# Patient Record
Sex: Male | Born: 1935 | Race: White | Hispanic: No | Marital: Married | Smoking: Former smoker
Health system: Southern US, Community
[De-identification: ages and names within clinical notes are randomized; demographics above are authoritative.]

## PROBLEM LIST (undated history)

## (undated) DIAGNOSIS — R011 Cardiac murmur, unspecified: Secondary | ICD-10-CM

## (undated) DIAGNOSIS — K265 Chronic or unspecified duodenal ulcer with perforation: Secondary | ICD-10-CM

## (undated) DIAGNOSIS — T7840XA Allergy, unspecified, initial encounter: Secondary | ICD-10-CM

## (undated) DIAGNOSIS — I10 Essential (primary) hypertension: Secondary | ICD-10-CM

## (undated) DIAGNOSIS — E785 Hyperlipidemia, unspecified: Secondary | ICD-10-CM

## (undated) DIAGNOSIS — I219 Acute myocardial infarction, unspecified: Secondary | ICD-10-CM

## (undated) HISTORY — DX: Cardiac murmur, unspecified: R01.1

## (undated) HISTORY — PX: CORONARY ARTERY BYPASS GRAFT: SHX141

## (undated) HISTORY — DX: Acute myocardial infarction, unspecified: I21.9

## (undated) HISTORY — DX: Essential (primary) hypertension: I10

## (undated) HISTORY — DX: Chronic or unspecified duodenal ulcer with perforation: K26.5

## (undated) HISTORY — DX: Hyperlipidemia, unspecified: E78.5

---

## 1998-04-21 ENCOUNTER — Other Ambulatory Visit: Admission: RE | Admit: 1998-04-21 | Discharge: 1998-04-21 | Payer: Self-pay | Admitting: Internal Medicine

## 1999-05-06 ENCOUNTER — Ambulatory Visit (HOSPITAL_COMMUNITY): Admission: RE | Admit: 1999-05-06 | Discharge: 1999-05-06 | Payer: Self-pay | Admitting: Interventional Cardiology

## 2001-04-26 ENCOUNTER — Emergency Department (HOSPITAL_COMMUNITY): Admission: EM | Admit: 2001-04-26 | Discharge: 2001-04-26 | Payer: Self-pay | Admitting: Emergency Medicine

## 2001-04-26 ENCOUNTER — Encounter: Payer: Self-pay | Admitting: Emergency Medicine

## 2003-09-17 ENCOUNTER — Ambulatory Visit (HOSPITAL_COMMUNITY): Admission: RE | Admit: 2003-09-17 | Discharge: 2003-09-17 | Payer: Self-pay | Admitting: Gastroenterology

## 2005-09-29 ENCOUNTER — Inpatient Hospital Stay (HOSPITAL_BASED_OUTPATIENT_CLINIC_OR_DEPARTMENT_OTHER): Admission: RE | Admit: 2005-09-29 | Discharge: 2005-09-29 | Payer: Self-pay | Admitting: Interventional Cardiology

## 2005-10-21 ENCOUNTER — Inpatient Hospital Stay (HOSPITAL_COMMUNITY): Admission: RE | Admit: 2005-10-21 | Discharge: 2005-10-25 | Payer: Self-pay | Admitting: Cardiothoracic Surgery

## 2005-10-28 ENCOUNTER — Emergency Department (HOSPITAL_COMMUNITY): Admission: EM | Admit: 2005-10-28 | Discharge: 2005-10-28 | Payer: Self-pay | Admitting: Emergency Medicine

## 2005-11-17 ENCOUNTER — Encounter (HOSPITAL_COMMUNITY): Admission: RE | Admit: 2005-11-17 | Discharge: 2006-02-15 | Payer: Self-pay | Admitting: Interventional Cardiology

## 2006-01-05 ENCOUNTER — Ambulatory Visit (HOSPITAL_COMMUNITY): Admission: RE | Admit: 2006-01-05 | Discharge: 2006-01-05 | Payer: Self-pay | Admitting: Interventional Cardiology

## 2012-07-20 ENCOUNTER — Encounter (HOSPITAL_COMMUNITY): Payer: Self-pay | Admitting: Anesthesiology

## 2012-07-20 ENCOUNTER — Emergency Department (HOSPITAL_COMMUNITY): Payer: PRIVATE HEALTH INSURANCE

## 2012-07-20 ENCOUNTER — Inpatient Hospital Stay (HOSPITAL_COMMUNITY)
Admission: EM | Admit: 2012-07-20 | Discharge: 2012-07-27 | DRG: 326 | Disposition: A | Discharge status: Skilled Nursing Facility | Payer: PRIVATE HEALTH INSURANCE | Attending: General Surgery | Admitting: General Surgery

## 2012-07-20 ENCOUNTER — Encounter (HOSPITAL_COMMUNITY): Payer: Self-pay | Admitting: Radiology

## 2012-07-20 DIAGNOSIS — K265 Chronic or unspecified duodenal ulcer with perforation: Secondary | ICD-10-CM

## 2012-07-20 DIAGNOSIS — K650 Generalized (acute) peritonitis: Secondary | ICD-10-CM

## 2012-07-20 DIAGNOSIS — R000 Tachycardia, unspecified: Secondary | ICD-10-CM | POA: Diagnosis present

## 2012-07-20 DIAGNOSIS — R5381 Other malaise: Secondary | ICD-10-CM | POA: Diagnosis present

## 2012-07-20 DIAGNOSIS — I959 Hypotension, unspecified: Secondary | ICD-10-CM | POA: Diagnosis present

## 2012-07-20 DIAGNOSIS — K659 Peritonitis, unspecified: Secondary | ICD-10-CM | POA: Diagnosis present

## 2012-07-20 DIAGNOSIS — R109 Unspecified abdominal pain: Secondary | ICD-10-CM

## 2012-07-20 DIAGNOSIS — K859 Acute pancreatitis without necrosis or infection, unspecified: Secondary | ICD-10-CM

## 2012-07-20 DIAGNOSIS — R198 Other specified symptoms and signs involving the digestive system and abdomen: Secondary | ICD-10-CM

## 2012-07-20 HISTORY — DX: Chronic or unspecified duodenal ulcer with perforation: K26.5

## 2012-07-20 LAB — CBC WITH DIFFERENTIAL/PLATELET
Eosinophils Absolute: 0.2 10*3/uL (ref 0.0–0.7)
Eosinophils Relative: 2 % (ref 0–5)
HCT: 39.3 % (ref 39.0–52.0)
Lymphocytes Relative: 24 % (ref 12–46)
Lymphs Abs: 2.4 10*3/uL (ref 0.7–4.0)
Monocytes Relative: 5 % (ref 3–12)
Neutro Abs: 6.7 10*3/uL (ref 1.7–7.7)
Neutrophils Relative %: 68 % (ref 43–77)
Platelets: 215 10*3/uL (ref 150–400)
RBC: 4.12 MIL/uL — ABNORMAL LOW (ref 4.22–5.81)
WBC: 9.9 10*3/uL (ref 4.0–10.5)

## 2012-07-20 LAB — COMPREHENSIVE METABOLIC PANEL
Alkaline Phosphatase: 84 U/L (ref 39–117)
CO2: 25 mEq/L (ref 19–32)
Calcium: 9.3 mg/dL (ref 8.4–10.5)
GFR calc Af Amer: 86 mL/min — ABNORMAL LOW (ref >90)
GFR calc non Af Amer: 75 mL/min — ABNORMAL LOW (ref >90)
Sodium: 139 mEq/L (ref 135–145)
Total Bilirubin: 0.5 mg/dL (ref 0.3–1.2)
Total Protein: 7.1 g/dL (ref 6.0–8.3)

## 2012-07-20 MED ORDER — IOHEXOL 300 MG/ML  SOLN
20.0000 mL | INTRAMUSCULAR | Status: AC
  Administered 2012-07-20: 20 mL via ORAL

## 2012-07-20 MED ORDER — SODIUM CHLORIDE 0.9 % IV SOLN
1000.0000 mL | Freq: Once | INTRAVENOUS | Status: AC
  Administered 2012-07-20: 1000 mL via INTRAVENOUS

## 2012-07-20 MED ORDER — IOHEXOL 300 MG/ML  SOLN
100.0000 mL | Freq: Once | INTRAMUSCULAR | Status: AC | PRN
  Administered 2012-07-20: 100 mL via INTRAVENOUS

## 2012-07-20 MED ORDER — SODIUM CHLORIDE 0.9 % IV SOLN
1000.0000 mL | INTRAVENOUS | Status: DC
  Administered 2012-07-20 – 2012-07-22 (×5): 1000 mL via INTRAVENOUS

## 2012-07-20 MED ORDER — ONDANSETRON HCL 4 MG/2ML IJ SOLN
4.0000 mg | Freq: Once | INTRAMUSCULAR | Status: AC
  Administered 2012-07-20: 4 mg via INTRAVENOUS
  Filled 2012-07-20: qty 2

## 2012-07-20 MED ORDER — HYDROMORPHONE HCL PF 1 MG/ML IJ SOLN
0.5000 mg | INTRAMUSCULAR | Status: DC | PRN
  Administered 2012-07-20 (×2): 0.5 mg via INTRAVENOUS
  Filled 2012-07-20 (×2): qty 1

## 2012-07-20 NOTE — ED Notes (Signed)
Patient to ED with C/O lower abdominal pain.  Onset 1530.  Patient had CP initially with onset this AM.  Patient took NTG and ASA.

## 2012-07-20 NOTE — Anesthesia Preprocedure Evaluation (Addendum)
Anesthesia Evaluation  Patient identified by MRN, date of birth, ID band Patient awake    Reviewed: Allergy & Precautions, H&P , NPO status , Patient's Chart, lab work & pertinent test results  Airway Mallampati: II TM Distance: >3 FB Neck ROM: Full    Dental  (+) Teeth Intact and Dental Advisory Given   Pulmonary former smoker,  breath sounds clear to auscultation        Cardiovascular hypertension, + CAD and + CABG Rhythm:Regular Rate:Normal + Systolic murmurs    Neuro/Psych    GI/Hepatic Perforated ulcer   Endo/Other    Renal/GU      Musculoskeletal   Abdominal   Peds  Hematology   Anesthesia Other Findings   Reproductive/Obstetrics                          Anesthesia Physical Anesthesia Plan  ASA: II  Anesthesia Plan: General   Post-op Pain Management:    Induction: Intravenous  Airway Management Planned: Oral ETT  Additional Equipment:   Intra-op Plan:   Post-operative Plan: Possible Post-op intubation/ventilation and Extubation in OR  Informed Consent: I have reviewed the patients History and Physical, chart, labs and discussed the procedure including the risks, benefits and alternatives for the proposed anesthesia with the patient or authorized representative who has indicated his/her understanding and acceptance.     Plan Discussed with: CRNA, Anesthesiologist and Surgeon  Anesthesia Plan Comments:         Anesthesia Quick Evaluation

## 2012-07-20 NOTE — ED Notes (Signed)
Pt informed of need to urinate. Unable to at this time.

## 2012-07-20 NOTE — ED Notes (Signed)
Physician at bedside.

## 2012-07-20 NOTE — ED Notes (Signed)
Daughter Dennie Bible): [phone]. Please call prior to pt surgery

## 2012-07-20 NOTE — ED Notes (Signed)
Report received from [place], RN in [place].

## 2012-07-20 NOTE — ED Notes (Signed)
Patient states that he woke up from his nap at [facility] and felt terrible.  States that he had pain just beneath his rib cage and in his abdomen.  States that the pain is mostly in his Right upper quadrant. Patient is nauseated at present.  States that he has been constipated with his last BM 2 days ago.

## 2012-07-20 NOTE — ED Provider Notes (Addendum)
History     CSN: [id number] Arrival date & time 07/20/12  1734 First MD Initiated Contact with Patient 07/20/12 1821      Chief Complaint  Patient presents with  . Abdominal Pain    Patient is a 78 y.o. male presenting with abdominal pain and chest pain. The history is provided by the patient.  Abdominal Pain The primary symptoms of the illness include abdominal pain and nausea. The primary symptoms of the illness do not include fever, vomiting, diarrhea or dysuria.  Additional symptoms associated with the illness include constipation.  Chest Pain The chest pain began 3 - 5 hours ago (at 3:30 pm). Chest pain occurs constantly. The chest pain is improving. Associated with: on both sides of the chest but more on the right. The severity of the pain is moderate. Chest pain is worsened by deep breathing. Primary symptoms include abdominal pain and nausea. Pertinent negatives for primary symptoms include no fever and no vomiting.  Onset: He thinks it started at the same time as the abdominal pain. The abdominal pain has been gradually worsening since its onset. The abdominal pain is located in the RLQ. The abdominal pain does not radiate.     No past medical history on file. Gout and hypercholesterolemia  No past surgical history on file. CABG (approx 8 years ago), No GB or appendix  No family history on file.  History  Substance Use Topics  . Smoking status: Not on file  . Smokeless tobacco: Not on file  . Alcohol Use:  daily use    patient does drink alcohol daily    Review of Systems  Constitutional: Negative for fever.  Cardiovascular: Positive for chest pain.  Gastrointestinal: Positive for nausea, abdominal pain and constipation. Negative for vomiting and diarrhea.  Genitourinary: Negative for dysuria.  All other systems reviewed and are negative.    Allergies  Review of patient's allergies indicates no known allergies.  Home Medications   Current Outpatient Rx    Name Route Sig Dispense Refill  . ASPIRIN 325 MG PO TABS Oral Take 325 mg by mouth daily.    . ATORVASTATIN CALCIUM 10 MG PO TABS Oral Take 10 mg by mouth daily.    . INDOMETHACIN 50 MG PO CAPS Oral Take 50 mg by mouth 3 (three) times daily with meals.    . ADULT MULTIVITAMIN W/MINERALS CH Oral Take 1 tablet by mouth daily.    . TERBINAFINE HCL 250 MG PO TABS Oral Take 250 mg by mouth daily.      BP 111/62  Pulse 67  Temp 97.3 F (36.3 C) (Oral)  Resp 20  SpO2 95%  Physical Exam  Nursing note and vitals reviewed. Constitutional: He appears well-developed and well-nourished. No distress.  HENT:  Head: Normocephalic and atraumatic.  Right Ear: External ear normal.  Left Ear: External ear normal.  Eyes: Conjunctivae normal are normal. Right eye exhibits no discharge. Left eye exhibits no discharge. No scleral icterus.  Neck: Neck supple. No tracheal deviation present.  Cardiovascular: Normal rate, regular rhythm and intact distal pulses.   Pulmonary/Chest: Effort normal and breath sounds normal. No stridor. No respiratory distress. He has no wheezes. He has no rales.  Abdominal: Soft. Bowel sounds are normal. He exhibits no distension. There is tenderness in the right upper quadrant. There is no rigidity, no rebound and no guarding. No hernia.  Musculoskeletal: He exhibits no edema and no tenderness.  Neurological: He is alert. He has normal strength. No sensory deficit.  Cranial nerve deficit:  no gross defecits noted. He exhibits normal muscle tone. He displays no seizure activity. Coordination normal.  Skin: Skin is warm and dry. No rash noted.  Psychiatric: He has a normal mood and affect.    ED Course  Procedures (including critical care time)  Rate: 66  Rhythm: normal sinus rhythm  QRS Axis: normal  Intervals: normal  ST/T Wave abnormalities: normal  Conduction Disutrbances:none  Narrative Interpretation: nl  Old EKG Reviewed: none available CRITICAL CARE Performed by:  Linwood Dibbles R Total critical care time: 35 Critical care time was exclusive of separately billable procedures and treating other patients. Critical care was necessary to treat or prevent imminent or life-threatening deterioration. Critical care was time spent personally by me on the following activities: development of treatment plan with patient and/or surrogate as well as nursing, discussions with consultants, evaluation of patient's response to treatment, examination of patient, obtaining history from patient or surrogate, ordering and performing treatments and interventions, ordering and review of laboratory studies, ordering and review of radiographic studies, pulse oximetry and re-evaluation of patient's condition.   Labs Reviewed  COMPREHENSIVE METABOLIC PANEL - Abnormal; Notable for the following:    Glucose, Bld 125 (*)     GFR calc non Af Amer 75 (*)     GFR calc Af Amer 86 (*)     All other components within normal limits  LIPASE, BLOOD - Abnormal; Notable for the following:    Lipase 158 (*)     All other components within normal limits  CBC WITH DIFFERENTIAL - Abnormal; Notable for the following:    RBC 4.12 (*)     All other components within normal limits  POCT I-STAT TROPONIN I  URINALYSIS, ROUTINE W REFLEX MICROSCOPIC   Ct Abdomen Pelvis W Contrast  07/20/2012  *RADIOLOGY REPORT*  Clinical Data: Abdominal pain  CT ABDOMEN AND PELVIS WITH CONTRAST  Technique:  Multidetector CT imaging of the abdomen and pelvis was performed following the standard protocol during bolus administration of intravenous contrast.  Contrast: OMNIPAQUE IOHEXOL 300 MG/ML  SOLN  Comparison: None.  Findings: There are small bilateral pleural effusions and basilar atelectasis.  No pericardial fluid.  Coronary calcifications present.  There is a moderate volume intraperitoneal free air in the upper abdomen collecting non dependently along the ventral peritoneal surface anterior ladder and left upper  quadrant beneath the left hemidiaphragm.  There is high density fluid lateral to the right margin of the liver.  These findings are suggestive perforated GI tract proximally in stomach or duodenum.  There is a potential rent in the anterior wall of the stomach towards the fundus (image 12, series 2).  There is also seen on coronal image 50.  A small gas pocket adjacent to the first portion the duodenum along the medial border measuring 16 x 13 mm.  This could represent a perforation but more likely represents a diverticulum.  There is no significant free fluid in the pelvis.  The liver appears normal.  The gallbladder wall is mildly thickened.  Small gas in the porta hepatis.  The pancreas appears normal.  The spleen, adrenal glands, kidneys appear normal.  The abdominal aorta is normal caliber.  The abdominal lymphadenopathy.  No free fluid the pelvis.  The prostate gland bladder normal.  No pelvic lymphadenopathy. Review of  bone windows demonstrates no aggressive osseous lesions.  IMPRESSION:  1. Intraperitoneal free air and fluid in the upper abdomen suggests proximal GI perforation.  Favor that the  fundus of the stomach as likely source of perforation.  A second consideration would be along the medial margin of the second portion the duodenum although this is more likely a diverticulum.  2.  Significant coronary artery calcifications.  Findings conveyed to Dr. Lynelle Doctor on 07/20/2012  21:45   Original Report Authenticated By: Genevive Bi, M.D.      1. Acute pancreatitis       MDM  The patient's labs show an elevation of his lipase consistent with pancreatitis. I asked the patient about his alcohol consumption he does drink 3 ounces daily. It is possible that this could be the cause of his pancreatitis however do feel that he will need additional abdominal imaging. I will start with a CT scan of the abdomen and pelvis. Plan will be to admit him to the hospital for continued IV hydration and pain  management  9:59 PM CT scan reveal perforated viscus.  I have consulted with Dr Luisa Hart general surgery.  Findings discussed with the family.  Pt remains hemodynamically stable.  Will continue to monitor.   Celene Kras, MD 07/20/12 2039  Celene Kras, MD 07/20/12 [phone]

## 2012-07-20 NOTE — H&P (Signed)
Shane Berry is an 78 y.o. male.   Chief Complaint: abdominal pain HPI: 1 week history of epigastric abdominal pain which worsened  acutely today.  Severe sharp in epigastrium. ETOH use daily and indocin  Does not smoke  History reviewed. No pertinent past medical history.  No past surgical history on file.  No family history on file. Social History:  does not have a smoking history on file. He does not have any smokeless tobacco history on file. His alcohol and drug histories not on file.  Allergies: No Known Allergies   (Not in a hospital admission)  Results for orders placed during the hospital encounter of 07/20/12 (from the past 48 hour(s))  COMPREHENSIVE METABOLIC PANEL     Status: Abnormal   Collection Time   07/20/12  6:37 PM      Component Value Range Comment   Sodium 139  135 - 145 mEq/L    Potassium 4.6  3.5 - 5.1 mEq/L    Chloride 104  96 - 112 mEq/L    CO2 25  19 - 32 mEq/L    Glucose, Bld 125 (*) 70 - 99 mg/dL    BUN 20  6 - 23 mg/dL    Creatinine, Ser 1.61  0.50 - 1.35 mg/dL    Calcium 9.3  8.4 - 09.6 mg/dL    Total Protein 7.1  6.0 - 8.3 g/dL    Albumin 3.9  3.5 - 5.2 g/dL    AST 27  0 - 37 U/L    ALT 12  0 - 53 U/L    Alkaline Phosphatase 84  39 - 117 U/L    Total Bilirubin 0.5  0.3 - 1.2 mg/dL    GFR calc non Af Amer 75 (*) >90 mL/min    GFR calc Af Amer 86 (*) >90 mL/min   LIPASE, BLOOD     Status: Abnormal   Collection Time   07/20/12  6:37 PM      Component Value Range Comment   Lipase 158 (*) 11 - 59 U/L   CBC WITH DIFFERENTIAL     Status: Abnormal   Collection Time   07/20/12  6:37 PM      Component Value Range Comment   WBC 9.9  4.0 - 10.5 K/uL    RBC 4.12 (*) 4.22 - 5.81 MIL/uL    Hemoglobin 13.8  13.0 - 17.0 g/dL    HCT 04.5  40.9 - 81.1 %    MCV 95.4  78.0 - 100.0 fL    MCH 33.5  26.0 - 34.0 pg    MCHC 35.1  30.0 - 36.0 g/dL    RDW 91.4  78.2 - 95.6 %    Platelets 215  150 - 400 K/uL    Neutrophils Relative 68  43 - 77 %    Neutro  Abs 6.7  1.7 - 7.7 K/uL    Lymphocytes Relative 24  12 - 46 %    Lymphs Abs 2.4  0.7 - 4.0 K/uL    Monocytes Relative 5  3 - 12 %    Monocytes Absolute 0.5  0.1 - 1.0 K/uL    Eosinophils Relative 2  0 - 5 %    Eosinophils Absolute 0.2  0.0 - 0.7 K/uL    Basophils Relative 0  0 - 1 %    Basophils Absolute 0.0  0.0 - 0.1 K/uL   POCT I-STAT TROPONIN I     Status: Normal   Collection Time  07/20/12  6:51 PM      Component Value Range Comment   Troponin i, poc 0.01  0.00 - 0.08 ng/mL    Comment 3             Ct Abdomen Pelvis W Contrast  07/20/2012  *RADIOLOGY REPORT*  Clinical Data: Abdominal pain  CT ABDOMEN AND PELVIS WITH CONTRAST  Technique:  Multidetector CT imaging of the abdomen and pelvis was performed following the standard protocol during bolus administration of intravenous contrast.  Contrast: OMNIPAQUE IOHEXOL 300 MG/ML  SOLN  Comparison: None.  Findings: There are small bilateral pleural effusions and basilar atelectasis.  No pericardial fluid.  Coronary calcifications present.  There is a moderate volume intraperitoneal free air in the upper abdomen collecting non dependently along the ventral peritoneal surface anterior ladder and left upper quadrant beneath the left hemidiaphragm.  There is high density fluid lateral to the right margin of the liver.  These findings are suggestive perforated GI tract proximally in stomach or duodenum.  There is a potential rent in the anterior wall of the stomach towards the fundus (image 12, series 2).  There is also seen on coronal image 50.  A small gas pocket adjacent to the first portion the duodenum along the medial border measuring 16 x 13 mm.  This could represent a perforation but more likely represents a diverticulum.  There is no significant free fluid in the pelvis.  The liver appears normal.  The gallbladder wall is mildly thickened.  Small gas in the porta hepatis.  The pancreas appears normal.  The spleen, adrenal glands, kidneys  appear normal.  The abdominal aorta is normal caliber.  The abdominal lymphadenopathy.  No free fluid the pelvis.  The prostate gland bladder normal.  No pelvic lymphadenopathy. Review of  bone windows demonstrates no aggressive osseous lesions.  IMPRESSION:  1. Intraperitoneal free air and fluid in the upper abdomen suggests proximal GI perforation.  Favor that the fundus of the stomach as likely source of perforation.  A second consideration would be along the medial margin of the second portion the duodenum although this is more likely a diverticulum.  2.  Significant coronary artery calcifications.  Findings conveyed to Dr. Lynelle Doctor on 07/20/2012  21:45   Original Report Authenticated By: Genevive Bi, M.D.     Review of Systems  Constitutional: Negative.   HENT: Negative.   Eyes: Negative.   Cardiovascular: Negative.   Gastrointestinal: Positive for nausea and abdominal pain.  Musculoskeletal: Positive for joint pain.  Skin: Negative.   Neurological: Negative.   Endo/Heme/Allergies: Negative.   Psychiatric/Behavioral: Negative.     Blood pressure 111/62, pulse 67, temperature 97.3 F (36.3 C), temperature source Oral, resp. rate 20, SpO2 95.00%. Physical Exam  Constitutional: He is oriented to person, place, and time. He appears well-developed and well-nourished.  HENT:  Head: Normocephalic and atraumatic.  Eyes: EOM are normal. Pupils are equal, round, and reactive to light.  Neck: Normal range of motion. Neck supple.  Cardiovascular: A regularly irregular rhythm present. Tachycardia present.   Respiratory: Effort normal and breath sounds normal.  GI: There is tenderness. There is rebound and guarding.  Musculoskeletal: Normal range of motion.  Neurological: He is alert and oriented to person, place, and time.  Skin: Skin is warm and dry.  Psychiatric: He has a normal mood and affect. His behavior is normal. Judgment and thought content normal.     Assessment/Plan Free air and  peritonitis OR for ex lap.  Risks,  Benefits and alternative treatments discussed.  Pt and family agree.  Risks include bleeding,  Infection,  Organ failure,  Death,  Wound complications,  Death,  MI, DVT and more surgery.  The patient understands and agrees.  Nilda Keathley A. 07/20/2012, 10:12 PM

## 2012-07-21 ENCOUNTER — Observation Stay (HOSPITAL_COMMUNITY): Payer: PRIVATE HEALTH INSURANCE | Admitting: Anesthesiology

## 2012-07-21 ENCOUNTER — Encounter (HOSPITAL_COMMUNITY): Payer: Self-pay | Admitting: *Deleted

## 2012-07-21 ENCOUNTER — Encounter (HOSPITAL_COMMUNITY): Admission: EM | Disposition: A | Discharge status: Skilled Nursing Facility | Payer: Self-pay | Source: Home / Self Care

## 2012-07-21 ENCOUNTER — Encounter (HOSPITAL_COMMUNITY): Payer: Self-pay | Admitting: Anesthesiology

## 2012-07-21 DIAGNOSIS — K261 Acute duodenal ulcer with perforation: Secondary | ICD-10-CM

## 2012-07-21 HISTORY — PX: LAPAROTOMY: SHX154

## 2012-07-21 LAB — TROPONIN I: Troponin I: <0.3 ng/mL (ref <0.30)

## 2012-07-21 LAB — MRSA PCR SCREENING: MRSA by PCR: NEGATIVE

## 2012-07-21 LAB — URINALYSIS, MICROSCOPIC ONLY
Ketones, ur: 15 mg/dL — AB
Leukocytes, UA: NEGATIVE
Protein, ur: NEGATIVE mg/dL
Urobilinogen, UA: 0.2 mg/dL (ref 0.0–1.0)

## 2012-07-21 LAB — COMPREHENSIVE METABOLIC PANEL
ALT: 14 U/L (ref 0–53)
AST: 24 U/L (ref 0–37)
Alkaline Phosphatase: 56 U/L (ref 39–117)
CO2: 25 mEq/L (ref 19–32)
Calcium: 8.1 mg/dL — ABNORMAL LOW (ref 8.4–10.5)
Potassium: 4.6 mEq/L (ref 3.5–5.1)
Sodium: 138 mEq/L (ref 135–145)
Total Protein: 6 g/dL (ref 6.0–8.3)

## 2012-07-21 LAB — CBC
HCT: 39.8 % (ref 39.0–52.0)
Hemoglobin: 13.7 g/dL (ref 13.0–17.0)
MCH: 33.3 pg (ref 26.0–34.0)
RBC: 4.12 MIL/uL — ABNORMAL LOW (ref 4.22–5.81)

## 2012-07-21 SURGERY — LAPAROTOMY, EXPLORATORY
Anesthesia: General | Site: Abdomen | Wound class: Dirty or Infected

## 2012-07-21 MED ORDER — DIPHENHYDRAMINE HCL 12.5 MG/5ML PO ELIX
12.5000 mg | ORAL_SOLUTION | Freq: Four times a day (QID) | ORAL | Status: DC | PRN
  Filled 2012-07-21: qty 5

## 2012-07-21 MED ORDER — DEXTROSE-NACL 5-0.45 % IV SOLN
INTRAVENOUS | Status: AC
  Administered 2012-07-21: 100 mL/h via INTRAVENOUS

## 2012-07-21 MED ORDER — MORPHINE SULFATE 2 MG/ML IJ SOLN
1.0000 mg | INTRAMUSCULAR | Status: DC | PRN
  Administered 2012-07-21: 2 mg via INTRAVENOUS
  Filled 2012-07-21: qty 1

## 2012-07-21 MED ORDER — PHENYLEPHRINE HCL 10 MG/ML IJ SOLN
INTRAMUSCULAR | Status: DC | PRN
  Administered 2012-07-21 (×5): 80 ug via INTRAVENOUS

## 2012-07-21 MED ORDER — NEOSTIGMINE METHYLSULFATE 1 MG/ML IJ SOLN
INTRAMUSCULAR | Status: DC | PRN
  Administered 2012-07-21: 5 mg via INTRAVENOUS

## 2012-07-21 MED ORDER — SODIUM CHLORIDE 0.9 % IV BOLUS (SEPSIS)
500.0000 mL | Freq: Once | INTRAVENOUS | Status: AC
  Administered 2012-07-21: 500 mL via INTRAVENOUS

## 2012-07-21 MED ORDER — HYDROMORPHONE 0.3 MG/ML IV SOLN
INTRAVENOUS | Status: DC
  Administered 2012-07-21: 1.2 mg via INTRAVENOUS
  Administered 2012-07-21: 03:00:00 via INTRAVENOUS

## 2012-07-21 MED ORDER — LACTATED RINGERS IV SOLN
INTRAVENOUS | Status: DC | PRN
  Administered 2012-07-21 (×2): via INTRAVENOUS

## 2012-07-21 MED ORDER — DIPHENHYDRAMINE HCL 50 MG/ML IJ SOLN: 12.5000 mg | Freq: Four times a day (QID) | INTRAMUSCULAR | Status: DC | PRN

## 2012-07-21 MED ORDER — ONDANSETRON HCL 4 MG/2ML IJ SOLN: 4.0000 mg | Freq: Four times a day (QID) | INTRAMUSCULAR | Status: DC | PRN

## 2012-07-21 MED ORDER — MAGNESIUM SULFATE 40 MG/ML IJ SOLN
2.0000 g | Freq: Once | INTRAMUSCULAR | Status: AC
  Administered 2012-07-21: 2 g via INTRAVENOUS
  Filled 2012-07-21: qty 50

## 2012-07-21 MED ORDER — SODIUM CHLORIDE 0.9 % IV SOLN
INTRAVENOUS | Status: DC | PRN
  Administered 2012-07-21: via INTRAVENOUS

## 2012-07-21 MED ORDER — GLYCOPYRROLATE 0.2 MG/ML IJ SOLN
INTRAMUSCULAR | Status: DC | PRN
  Administered 2012-07-21: .8 mg via INTRAVENOUS

## 2012-07-21 MED ORDER — PANTOPRAZOLE SODIUM 40 MG IV SOLR
40.0000 mg | Freq: Two times a day (BID) | INTRAVENOUS | Status: DC
  Administered 2012-07-21 – 2012-07-25 (×10): 40 mg via INTRAVENOUS
  Filled 2012-07-21 (×14): qty 40

## 2012-07-21 MED ORDER — HYDROMORPHONE HCL PF 1 MG/ML IJ SOLN: 0.2500 mg | INTRAMUSCULAR | Status: DC | PRN

## 2012-07-21 MED ORDER — PROPOFOL 10 MG/ML IV BOLUS
INTRAVENOUS | Status: DC | PRN
  Administered 2012-07-21: 120 mg via INTRAVENOUS

## 2012-07-21 MED ORDER — ONDANSETRON HCL 4 MG PO TABS: 4.0000 mg | ORAL_TABLET | Freq: Four times a day (QID) | ORAL | Status: DC | PRN

## 2012-07-21 MED ORDER — LIDOCAINE HCL (CARDIAC) 20 MG/ML IV SOLN
INTRAVENOUS | Status: DC | PRN
  Administered 2012-07-21: 100 mg via INTRAVENOUS

## 2012-07-21 MED ORDER — SODIUM CHLORIDE 0.9 % IV BOLUS (SEPSIS)
500.0000 mL | Freq: Once | INTRAVENOUS | Status: AC
Start: 2012-07-21 — End: 2012-07-21
  Administered 2012-07-21: 500 mL via INTRAVENOUS

## 2012-07-21 MED ORDER — ONDANSETRON HCL 4 MG/2ML IJ SOLN
INTRAMUSCULAR | Status: DC | PRN
  Administered 2012-07-21: 4 mg via INTRAVENOUS

## 2012-07-21 MED ORDER — ONDANSETRON HCL 4 MG/2ML IJ SOLN: 4.0000 mg | Freq: Once | INTRAMUSCULAR | Status: DC | PRN

## 2012-07-21 MED ORDER — FENTANYL CITRATE 0.05 MG/ML IJ SOLN
INTRAMUSCULAR | Status: DC | PRN
  Administered 2012-07-21 (×2): 50 ug via INTRAVENOUS
  Administered 2012-07-21: 100 ug via INTRAVENOUS

## 2012-07-21 MED ORDER — 0.9 % SODIUM CHLORIDE (POUR BTL) OPTIME
TOPICAL | Status: DC | PRN
  Administered 2012-07-21: 8000 mL

## 2012-07-21 MED ORDER — SODIUM CHLORIDE 0.9 % IJ SOLN: 9.0000 mL | INTRAMUSCULAR | Status: DC | PRN

## 2012-07-21 MED ORDER — ROCURONIUM BROMIDE 100 MG/10ML IV SOLN
INTRAVENOUS | Status: DC | PRN
  Administered 2012-07-21: 50 mg via INTRAVENOUS

## 2012-07-21 MED ORDER — NALOXONE HCL 0.4 MG/ML IJ SOLN: 0.4000 mg | INTRAMUSCULAR | Status: DC | PRN

## 2012-07-21 MED ORDER — ENOXAPARIN SODIUM 40 MG/0.4ML ~~LOC~~ SOLN
40.0000 mg | SUBCUTANEOUS | Status: DC
  Administered 2012-07-22 – 2012-07-27 (×6): 40 mg via SUBCUTANEOUS
  Filled 2012-07-21 (×7): qty 0.4

## 2012-07-21 MED ORDER — DEXTROSE 5 % IV SOLN
2.0000 g | Freq: Four times a day (QID) | INTRAVENOUS | Status: DC
  Administered 2012-07-21 – 2012-07-26 (×21): 2 g via INTRAVENOUS
  Filled 2012-07-21 (×26): qty 2

## 2012-07-21 MED ORDER — DEXTROSE 5 % IV SOLN
2.0000 g | INTRAVENOUS | Status: DC
  Filled 2012-07-21: qty 2

## 2012-07-21 MED ORDER — HYDROMORPHONE HCL PF 1 MG/ML IJ SOLN
0.2500 mg | INTRAMUSCULAR | Status: DC | PRN
  Administered 2012-07-21 (×2): 0.5 mg via INTRAVENOUS

## 2012-07-21 MED ORDER — ALBUMIN HUMAN 5 % IV SOLN
INTRAVENOUS | Status: DC | PRN
  Administered 2012-07-21: 01:00:00 via INTRAVENOUS

## 2012-07-21 MED ORDER — SODIUM CHLORIDE 0.9 % IV BOLUS (SEPSIS)
500.0000 mL | Freq: Once | INTRAVENOUS | Status: AC
  Administered 2012-07-21: 12:00:00 via INTRAVENOUS

## 2012-07-21 SURGICAL SUPPLY — 36 items
BANDAGE GAUZE ELAST BULKY 4 IN (GAUZE/BANDAGES/DRESSINGS) ×2 IMPLANT
BLADE SURG ROTATE 9660 (MISCELLANEOUS) IMPLANT
CANISTER SUCTION 2500CC (MISCELLANEOUS) ×2 IMPLANT
CHLORAPREP W/TINT 26ML (MISCELLANEOUS) ×2 IMPLANT
CLOTH BEACON ORANGE TIMEOUT ST (SAFETY) ×2 IMPLANT
COVER SURGICAL LIGHT HANDLE (MISCELLANEOUS) ×2 IMPLANT
DRAPE LAPAROSCOPIC ABDOMINAL (DRAPES) ×2 IMPLANT
DRAPE UTILITY 15X26 W/TAPE STR (DRAPE) ×4 IMPLANT
DRAPE WARM FLUID 44X44 (DRAPE) ×2 IMPLANT
ELECT REM PT RETURN 9FT ADLT (ELECTROSURGICAL) ×2
ELECTRODE REM PT RTRN 9FT ADLT (ELECTROSURGICAL) ×1 IMPLANT
GLOVE BIO SURGEON STRL SZ8 (GLOVE) ×2 IMPLANT
GLOVE BIOGEL PI IND STRL 8 (GLOVE) ×1 IMPLANT
GLOVE BIOGEL PI INDICATOR 8 (GLOVE) ×1
GOWN STRL NON-REIN LRG LVL3 (GOWN DISPOSABLE) ×4 IMPLANT
KIT BASIN OR (CUSTOM PROCEDURE TRAY) ×2 IMPLANT
KIT ROOM TURNOVER OR (KITS) ×2 IMPLANT
LIGASURE IMPACT 36 18CM CVD LR (INSTRUMENTS) IMPLANT
NS IRRIG 1000ML POUR BTL (IV SOLUTION) ×2 IMPLANT
PACK GENERAL/GYN (CUSTOM PROCEDURE TRAY) ×2 IMPLANT
PAD ARMBOARD 7.5X6 YLW CONV (MISCELLANEOUS) ×4 IMPLANT
SPECIMEN JAR LARGE (MISCELLANEOUS) IMPLANT
SPONGE GAUZE 4X4 12PLY (GAUZE/BANDAGES/DRESSINGS) ×2 IMPLANT
SPONGE LAP 18X18 X RAY DECT (DISPOSABLE) IMPLANT
STAPLER VISISTAT 35W (STAPLE) ×2 IMPLANT
SUCTION POOLE TIP (SUCTIONS) IMPLANT
SUT PDS AB 1 CTX 36 (SUTURE) IMPLANT
SUT VIC AB 2-0 SH 18 (SUTURE) ×2 IMPLANT
SUT VIC AB 3-0 SH 18 (SUTURE) ×2 IMPLANT
SUT VICRYL AB 3 0 TIES (SUTURE) ×2 IMPLANT
TAPE CLOTH SURG 6X10 WHT LF (GAUZE/BANDAGES/DRESSINGS) ×2 IMPLANT
TOWEL OR 17X24 6PK STRL BLUE (TOWEL DISPOSABLE) ×2 IMPLANT
TOWEL OR 17X26 10 PK STRL BLUE (TOWEL DISPOSABLE) ×2 IMPLANT
TRAY FOLEY CATH 14FRSI W/METER (CATHETERS) IMPLANT
WATER STERILE IRR 1000ML POUR (IV SOLUTION) ×2 IMPLANT
YANKAUER SUCT BULB TIP NO VENT (SUCTIONS) IMPLANT

## 2012-07-21 NOTE — Progress Notes (Signed)
Day of Surgery  Subjective: A/A/O this am, states that his pain is minimal at present. Appears to be in no acute distress.  BP is 81/55 HR 106 (will give 500cc NS bolus over 1 hr and reassess)  Objective: Vital signs in last 24 hours: Temp:  [97.3 F (36.3 C)-98.5 F (36.9 C)] 98.2 F (36.8 C) (10/26 0758) Pulse Rate:  [67-114] 107  (10/26 0700) Resp:  [6-27] 11  (10/26 0807) BP: (79-134)/(50-78) 85/54 mmHg (10/26 0700) SpO2:  [90 %-100 %] 97 % (10/26 0807) Weight:  [180 lb (81.647 kg)] 180 lb (81.647 kg) (10/26 0300)    Intake/Output from previous day: 10/25 0701 - 10/26 0700 In: 3338.3 [I.V.:2508.3; NG/GT:30; IV Piggyback:800] Out: 780 [Urine:655; Emesis/NG output:100; Blood:25] Intake/Output this shift:    General appearance: alert, cooperative, appears stated age, fatigued and no distress Chest: CTA bilaterally Cardiac sinus tach HR 106 (likely vol depletion related) Abdomen: old ss drng on dressing, wet to dry dressing in place. No BS, distended. Extremities:warm to touch, + pulses bilaterally, no edema NG in place ( output ) Labs: WBC wnl, H&H stable VSS: Hypotensive and tachycardic (will give 500cc NS bolus and reassess) not related to pain meds as PCA on hold.  Lab Results:   Basename 07/21/12 0330 07/20/12 1837  WBC 2.0* 9.9  HGB 13.7 13.8  HCT 39.8 39.3  PLT 208 215   BMET  Basename 07/21/12 0330 07/20/12 1837  NA 138 139  K 4.6 4.6  CL 106 104  CO2 25 25  GLUCOSE 126* 125*  BUN 20 20  CREATININE 1.02 0.91  CALCIUM 8.1* 9.3   PT/INR No results found for this basename: LABPROT:2,INR:2 in the last 72 hours ABG No results found for this basename: PHART:2,PCO2:2,PO2:2,HCO3:2 in the last 72 hours  Studies/Results: Ct Abdomen Pelvis W Contrast  07/20/2012  *RADIOLOGY REPORT*  Clinical Data: Abdominal pain  CT ABDOMEN AND PELVIS WITH CONTRAST  Technique:  Multidetector CT imaging of the abdomen and pelvis was performed following the standard  protocol during bolus administration of intravenous contrast.  Contrast: OMNIPAQUE IOHEXOL 300 MG/ML  SOLN  Comparison: None.  Findings: There are small bilateral pleural effusions and basilar atelectasis.  No pericardial fluid.  Coronary calcifications present.  There is a moderate volume intraperitoneal free air in the upper abdomen collecting non dependently along the ventral peritoneal surface anterior ladder and left upper quadrant beneath the left hemidiaphragm.  There is high density fluid lateral to the right margin of the liver.  These findings are suggestive perforated GI tract proximally in stomach or duodenum.  There is a potential rent in the anterior wall of the stomach towards the fundus (image 12, series 2).  There is also seen on coronal image 50.  A small gas pocket adjacent to the first portion the duodenum along the medial border measuring 16 x 13 mm.  This could represent a perforation but more likely represents a diverticulum.  There is no significant free fluid in the pelvis.  The liver appears normal.  The gallbladder wall is mildly thickened.  Small gas in the porta hepatis.  The pancreas appears normal.  The spleen, adrenal glands, kidneys appear normal.  The abdominal aorta is normal caliber.  The abdominal lymphadenopathy.  No free fluid the pelvis.  The prostate gland bladder normal.  No pelvic lymphadenopathy. Review of  bone windows demonstrates no aggressive osseous lesions.  IMPRESSION:  1. Intraperitoneal free air and fluid in the upper abdomen suggests proximal GI  perforation.  Favor that the fundus of the stomach as likely source of perforation.  A second consideration would be along the medial margin of the second portion the duodenum although this is more likely a diverticulum.  2.  Significant coronary artery calcifications.  Findings conveyed to Dr. Lynelle Doctor on 07/20/2012  21:45   Original Report Authenticated By: Genevive Bi, M.D.      Anti-infectives: Anti-infectives     Start     Dose/Rate Route Frequency Ordered Stop   07/21/12 0600   cefOXitin (MEFOXIN) 2 g in dextrose 5 % 50 mL IVPB  Status:  Discontinued        2 g 100 mL/hr over 30 Minutes Intravenous On call to O.R. 07/21/12 0307 07/21/12 0354   07/21/12 0400   cefOXitin (MEFOXIN) 2 g in dextrose 5 % 50 mL IVPB        2 g 100 mL/hr over 30 Minutes Intravenous Every 6 hours 07/21/12 0307            Assessment/Plan: Fee intra-abdominal air and fluid Peritonitis s/p Procedure(s) (LRB) with comments: EXPLORATORY LAPAROTOMY (N/A)  1. Hypotensive and tachycardic (probably vol depletion related) Will Bolus with 500ccNS over 1 hr and reassess. 2. Keep NPO 3. Hold PCA for now manage pain prn 4. Continue IVF, ABX 5. Recheck labs in am 6. Follow clinical picture    LOS: 1 day    Sukanya Goldblatt 07/21/2012

## 2012-07-21 NOTE — Progress Notes (Signed)
Hypomagnesemia   Mg replaced  

## 2012-07-21 NOTE — Preoperative (Signed)
Beta Blockers   Reason not to administer Beta Blockers:Not Applicable 

## 2012-07-21 NOTE — Progress Notes (Signed)
Some hypotension this am.  Ox3, appropriate  Ok uop abd soft cta with decreased bs at bases  Cont IV abx for perf ulcer D/c pca, morphine prn Cont fluid bolus - reassess BP later this am Npo IS, pulm toilet  Mary Sella. Andrey Campanile, MD, FACS General, Bariatric, & Minimally Invasive Surgery [facility], [place]

## 2012-07-21 NOTE — Anesthesia Postprocedure Evaluation (Signed)
  Anesthesia Post-op Note  Patient: Shane Berry  Procedure(s) Performed: Procedure(s) (LRB) with comments: EXPLORATORY LAPAROTOMY (N/A)  Patient Location: ICU  Anesthesia Type: General  Level of Consciousness: awake, alert  and oriented  Airway and Oxygen Therapy: Patient Spontanous Breathing and Patient connected to nasal cannula oxygen  Post-op Pain: moderate  Post-op Assessment: Post-op Vital signs reviewed, Patent Airway and Pain level controlled  Post-op Vital Signs: Reviewed  Complications: No apparent anesthesia complications

## 2012-07-21 NOTE — Anesthesia Procedure Notes (Signed)
Procedure Name: Intubation Date/Time: 07/21/2012 12:53 AM Performed by: Molli Hazard Pre-anesthesia Checklist: Patient identified, Emergency Drugs available, Suction available and Patient being monitored Patient Re-evaluated:Patient Re-evaluated prior to inductionOxygen Delivery Method: Circle system utilized Preoxygenation: Pre-oxygenation with 100% oxygen Intubation Type: IV induction, Rapid sequence and Cricoid Pressure applied Laryngoscope Size: Miller and 2 Grade View: Grade I Tube type: Oral Tube size: 7.5 mm Number of attempts: 1 Airway Equipment and Method: Stylet Placement Confirmation: ETT inserted through vocal cords under direct vision,  positive ETCO2 and breath sounds checked- equal and bilateral Secured at: 24 cm Tube secured with: Tape Dental Injury: Teeth and Oropharynx as per pre-operative assessment

## 2012-07-21 NOTE — Progress Notes (Signed)
pca pump d/c 20 ml of diluadid wasted with stacey yancey rn charge rn.

## 2012-07-21 NOTE — Op Note (Signed)
Preoperative diagnosis: Peritonitis  Postoperative diagnosis: Perforated duodenal ulcer  Procedure: Exploratory laparotomy with closure of duodenal ulcer using grams patch  Surgeon: Harriette Bouillon M.D.  Anesthesia: Gen. Endotracheal anesthesia  EBL: 100 cc  Drains: None   Specimens: None  Indications for procedure: Patient's 40 suture old male with the sudden onset of epigastric abdominal pain today. CT scan showed free intra-abdominal air and fluid. Upon examination, he had peritonitis I recommended exploratory laparotomy. Risks, benefits and alternative therapies discussed. These were understood the patient and family agreed to proceed.  Description of procedure: The patient was met in the holding area and questions are answered. He is taken back to the operating room placed upon the operating room table. After induction of general endotracheal anesthesia, Foley catheter is placed a nasogastric tube in place. He received 2 g of cefoxitin. Timeout was done the abdomen was prepped and draped in a sterile fashion. Midline incision in the upper abdomen was used. Dissection was carried the fat and fascia into the abdominal cavity. His copious amounts of turbid fluid which was suctioned out. Upon examination there is a perforation of the first portion of the duodenum. The stomach and remainder the small bowel and colon were normal. The perforation was closed with 2-0 Vicryl suture. The falciform ligament was taken down divided between 2-0 silk suture. The proximal portion of this laid over the ulcer perforation site very easily was used as a patch. 5 L of irrigation were used. Hemostasis achieved. Nasogastric tube is positioned in the stomach. Fascia closed with #1 PDS. Skin packed open. All final counts found to be correct sponge, needles and instruments. Patient was taken to recovery in stable and satisfactory condition.

## 2012-07-21 NOTE — Transfer of Care (Signed)
Immediate Anesthesia Transfer of Care Note  Patient: Shane Berry  Procedure(s) Performed: Procedure(s) (LRB) with comments: EXPLORATORY LAPAROTOMY (N/A)  Patient Location: PACU  Anesthesia Type: General  Level of Consciousness: awake, alert  and oriented  Airway & Oxygen Therapy: Patient Spontanous Breathing and Patient connected to nasal cannula oxygen  Post-op Assessment: Report given to PACU RN, Post -op Vital signs reviewed and stable and Patient moving all extremities X 4  Post vital signs: Reviewed and stable  Complications: No apparent anesthesia complications

## 2012-07-21 NOTE — Progress Notes (Signed)
Lab on floor to draw cbc and cmp.  In some mysterious fashion the cbc resulted, but the cmp was d/c'd.  The lab claims the test was d/c'd by the M.D. On the floor.  This is impossible since the M.D. Was never on the floor.  Lab is supposed to be running a cmp and mg level with the blood that was already drawn, thus saving the pt another stick.  My hope is that he lab can get it together long enough to result the missing labs at some point today, I will continue to monitor

## 2012-07-21 NOTE — Progress Notes (Signed)
Hypotension   Bolus ordered. Stat labs ordered to assess for acute reasons for hypotension in the perioperative time.Shane Berry

## 2012-07-22 LAB — BASIC METABOLIC PANEL
CO2: 23 mEq/L (ref 19–32)
Chloride: 107 mEq/L (ref 96–112)
Sodium: 138 mEq/L (ref 135–145)

## 2012-07-22 LAB — CBC
MCV: 97.3 fL (ref 78.0–100.0)
Platelets: 188 10*3/uL (ref 150–400)
RBC: 3.69 MIL/uL — ABNORMAL LOW (ref 4.22–5.81)
WBC: 11.1 10*3/uL — ABNORMAL HIGH (ref 4.0–10.5)

## 2012-07-22 LAB — MAGNESIUM: Magnesium: 2.3 mg/dL (ref 1.5–2.5)

## 2012-07-22 MED ORDER — SODIUM CHLORIDE 0.9 % IV SOLN
1000.0000 mL | INTRAVENOUS | Status: DC
  Administered 2012-07-22 – 2012-07-25 (×5): 1000 mL via INTRAVENOUS

## 2012-07-22 NOTE — Progress Notes (Signed)
1 Day Post-Op  Subjective: Feels OK.  Objective: Vital signs in last 24 hours: Temp:  [98.4 F (36.9 C)-99.2 F (37.3 C)] 98.4 F (36.9 C) (10/27 0737) Pulse Rate:  [88-106] 100  (10/27 0700) Resp:  [13-29] 26  (10/27 0700) BP: (72-126)/(49-74) 119/67 mmHg (10/27 0700) SpO2:  [94 %-99 %] 95 % (10/27 0700) Weight:  [196 lb 13.9 oz (89.3 kg)] 196 lb 13.9 oz (89.3 kg) (10/27 0400)    Intake/Output from previous day: 10/26 0701 - 10/27 0700 In: 4819 [I.V.:3529; NG/GT:90; IV Piggyback:1200] Out: 1225 [Urine:1025; Emesis/NG output:200] Intake/Output this shift:    Cardio: some mild ectopy noted Wounds intact Lungs CTA  Lab Results:   Basename 07/21/12 0330 07/20/12 1837  WBC 2.0* 9.9  HGB 13.7 13.8  HCT 39.8 39.3  PLT 208 215   BMET  Basename 07/21/12 0330 07/20/12 1837  NA 138 139  K 4.6 4.6  CL 106 104  CO2 25 25  GLUCOSE 126* 125*  BUN 20 20  CREATININE 1.02 0.91  CALCIUM 8.1* 9.3   PT/INR No results found for this basename: LABPROT:2,INR:2 in the last 72 hours ABG No results found for this basename: PHART:2,PCO2:2,PO2:2,HCO3:2 in the last 72 hours  Studies/Results: Ct Abdomen Pelvis W Contrast  07/20/2012  *RADIOLOGY REPORT*  Clinical Data: Abdominal pain  CT ABDOMEN AND PELVIS WITH CONTRAST  Technique:  Multidetector CT imaging of the abdomen and pelvis was performed following the standard protocol during bolus administration of intravenous contrast.  Contrast: OMNIPAQUE IOHEXOL 300 MG/ML  SOLN  Comparison: None.  Findings: There are small bilateral pleural effusions and basilar atelectasis.  No pericardial fluid.  Coronary calcifications present.  There is a moderate volume intraperitoneal free air in the upper abdomen collecting non dependently along the ventral peritoneal surface anterior ladder and left upper quadrant beneath the left hemidiaphragm.  There is high density fluid lateral to the right margin of the liver.  These findings are suggestive  perforated GI tract proximally in stomach or duodenum.  There is a potential rent in the anterior wall of the stomach towards the fundus (image 12, series 2).  There is also seen on coronal image 50.  A small gas pocket adjacent to the first portion the duodenum along the medial border measuring 16 x 13 mm.  This could represent a perforation but more likely represents a diverticulum.  There is no significant free fluid in the pelvis.  The liver appears normal.  The gallbladder wall is mildly thickened.  Small gas in the porta hepatis.  The pancreas appears normal.  The spleen, adrenal glands, kidneys appear normal.  The abdominal aorta is normal caliber.  The abdominal lymphadenopathy.  No free fluid the pelvis.  The prostate gland bladder normal.  No pelvic lymphadenopathy. Review of  bone windows demonstrates no aggressive osseous lesions.  IMPRESSION:  1. Intraperitoneal free air and fluid in the upper abdomen suggests proximal GI perforation.  Favor that the fundus of the stomach as likely source of perforation.  A second consideration would be along the medial margin of the second portion the duodenum although this is more likely a diverticulum.  2.  Significant coronary artery calcifications.  Findings conveyed to Dr. Lynelle Doctor on 07/20/2012  21:45   Original Report Authenticated By: Genevive Bi, M.D.     Anti-infectives: Anti-infectives     Start     Dose/Rate Route Frequency Ordered Stop   07/21/12 0600   cefOXitin (MEFOXIN) 2 g in dextrose 5 %  50 mL IVPB  Status:  Discontinued        2 g 100 mL/hr over 30 Minutes Intravenous On call to O.R. 07/21/12 0307 07/21/12 0354   07/21/12 0400   cefOXitin (MEFOXIN) 2 g in dextrose 5 % 50 mL IVPB        2 g 100 mL/hr over 30 Minutes Intravenous Every 6 hours 07/21/12 0307            Assessment/Plan: s/p Procedure(s) (LRB) with comments: EXPLORATORY LAPAROTOMY (N/A) d/c foley Continue ABX therapy due to Post-op infection To floor. Cont NGT  protonix DVT prophylaxsis  LOS: 2 days    Shane Murchison A. 07/22/2012

## 2012-07-23 ENCOUNTER — Encounter (HOSPITAL_COMMUNITY): Payer: Self-pay | Admitting: Surgery

## 2012-07-23 DIAGNOSIS — I10 Essential (primary) hypertension: Secondary | ICD-10-CM

## 2012-07-23 DIAGNOSIS — R5381 Other malaise: Secondary | ICD-10-CM

## 2012-07-23 MED ORDER — METOPROLOL TARTRATE 1 MG/ML IV SOLN
5.0000 mg | Freq: Four times a day (QID) | INTRAVENOUS | Status: DC | PRN
  Administered 2012-07-25: 5 mg via INTRAVENOUS
  Filled 2012-07-23: qty 5

## 2012-07-23 NOTE — Clinical Documentation Improvement (Signed)
GENERIC DOCUMENTATION CLARIFICATION QUERY  THIS DOCUMENT IS NOT A PERMANENT PART OF THE MEDICAL RECORD  TO RESPOND TO THE THIS QUERY, FOLLOW THE INSTRUCTIONS BELOW:  1. If needed, update documentation for the patient's encounter via the notes activity.  2. Access this query again and click edit on the In [employer].  3. After updating, or not, click F2 to complete all highlighted (required) fields concerning your review. Select "additional documentation in the medical record" OR "no additional documentation provided".  4. Click Sign note button.  5. The deficiency will fall out of your In Basket *Please let us know if you are not able to complete this workflow by phone or e-mail (listed below).  Please update your documentation within the medical record to reflect your response to this query.                                                                                        07/23/12   Dear Dr.Cornett / Associates,  In a better effort to capture your patient's severity of illness, reflect appropriate length of stay and utilization of resources, a review of the patient medical record has revealed the following indicators.    Based on your clinical judgment, please clarify and document in a progress note and/or discharge summary the clinical condition associated with the following supporting information:  In responding to this query please exercise your independent judgment.  The fact that a query is asked, does not imply that any particular answer is desired or expected.   Possible Clinical Conditions?  _______Acute Pancreatitis   _______Other Condition  _______Cannot Clinically Determine     Risk Factors: Pancreatitis noted per 10/25 progress notes.  Lab results: 10/25: lipase: 158   You may use possible, probable, or suspect with inpatient documentation. possible, probable, suspected diagnoses MUST be documented at the time of discharge  Reviewed: No additional  documentation provided.  Thank You,  Marciano Sequin,  Clinical Documentation Specialist:  Pager: [phone]  Health Information Management [place]

## 2012-07-23 NOTE — Progress Notes (Signed)
Agree with above.  -mobilize pt -con't NGT for now -con't abx

## 2012-07-23 NOTE — Progress Notes (Signed)
Patient ID: Shane Berry, male   DOB: 12-22-1923, 78 y.o.   MRN: [id number] 2 Days Post-Op  Subjective: Pt feels ok, except he has been "shaky" since surgery.  No flatus yet.  Feels "run down."  Voiding well  Objective: Vital signs in last 24 hours: Temp:  [97.6 F (36.4 C)-97.8 F (36.6 C)] 97.6 F (36.4 C) (10/28 0534) Pulse Rate:  [98-106] 98  (10/28 0534) Resp:  [18-20] 18  (10/28 0534) BP: (131-167)/(86-101) 157/100 mmHg (10/28 0534) SpO2:  [90 %-95 %] 95 % (10/28 0534) Last BM Date:  (pre op)  Intake/Output from previous day: 10/27 0701 - 10/28 0700 In: 1681.7 [I.V.:1391.7; NG/GT:30; IV Piggyback:60] Out: 625 [Urine:125; Emesis/NG output:500] Intake/Output this shift:    PE: Abd: soft, incision is covered, but dry.  Hypoactive BS, NGT with bilious output.  Appropriately tender  Lab Results:   Basename 07/22/12 0745 07/21/12 0330  WBC 11.1* 2.0*  HGB 12.2* 13.7  HCT 35.9* 39.8  PLT 188 208   BMET  Basename 07/22/12 0745 07/21/12 0330  NA 138 138  K 4.6 4.6  CL 107 106  CO2 23 25  GLUCOSE 88 126*  BUN 20 20  CREATININE 1.00 1.02  CALCIUM 8.3* 8.1*   PT/INR No results found for this basename: LABPROT:2,INR:2 in the last 72 hours CMP     Component Value Date/Time   NA 138 07/22/2012 0745   K 4.6 07/22/2012 0745   CL 107 07/22/2012 0745   CO2 23 07/22/2012 0745   GLUCOSE 88 07/22/2012 0745   BUN 20 07/22/2012 0745   CREATININE 1.00 07/22/2012 0745   CALCIUM 8.3* 07/22/2012 0745   PROT 6.0 07/21/2012 0330   ALBUMIN 3.3* 07/21/2012 0330   AST 24 07/21/2012 0330   ALT 14 07/21/2012 0330   ALKPHOS 56 07/21/2012 0330   BILITOT 0.5 07/21/2012 0330   GFRNONAA 66* 07/22/2012 0745   GFRAA 76* 07/22/2012 0745   Lipase     Component Value Date/Time   LIPASE 158* 07/20/2012 1837       Studies/Results: No results found.  Anti-infectives: Anti-infectives     Start     Dose/Rate Route Frequency Ordered Stop   07/21/12 0600   cefOXitin (MEFOXIN) 2 g  in dextrose 5 % 50 mL IVPB  Status:  Discontinued        2 g 100 mL/hr over 30 Minutes Intravenous On call to O.R. 07/21/12 0307 07/21/12 0354   07/21/12 0400   cefOXitin (MEFOXIN) 2 g in dextrose 5 % 50 mL IVPB        2 g 100 mL/hr over 30 Minutes Intravenous Every 6 hours 07/21/12 0307             Assessment/Plan  1. Perforated duodenal ulcer, s/p ex lap with duodenal ulcer repair and graham patch 2. Elevated BP 3. Deconditioning  Plan: 1. Add prn lopressor for elevated BP 2. Cont NGT until POD 3-4. 3. PT/OT to see patient for dc options.  Patient lives at home with his wife. 4. Cont IS and mobilization. 5. Check labs in the morning 6. Cont abx therapy (mefoxin)   LOS: 3 days    Lorice Lafave E 07/23/2012, 10:27 AM Pager: [phone]

## 2012-07-24 ENCOUNTER — Inpatient Hospital Stay (HOSPITAL_COMMUNITY): Payer: PRIVATE HEALTH INSURANCE

## 2012-07-24 ENCOUNTER — Telehealth (INDEPENDENT_AMBULATORY_CARE_PROVIDER_SITE_OTHER): Payer: Self-pay | Admitting: Surgery

## 2012-07-24 LAB — BASIC METABOLIC PANEL
BUN: 18 mg/dL (ref 6–23)
Chloride: 107 mEq/L (ref 96–112)
GFR calc Af Amer: >90 mL/min (ref >90)
Glucose, Bld: 81 mg/dL (ref 70–99)
Potassium: 3.3 mEq/L — ABNORMAL LOW (ref 3.5–5.1)

## 2012-07-24 LAB — CBC
HCT: 34.3 % — ABNORMAL LOW (ref 39.0–52.0)
Hemoglobin: 11.9 g/dL — ABNORMAL LOW (ref 13.0–17.0)
WBC: 10.9 10*3/uL — ABNORMAL HIGH (ref 4.0–10.5)

## 2012-07-24 MED ORDER — FUROSEMIDE 10 MG/ML IJ SOLN
20.0000 mg | Freq: Once | INTRAMUSCULAR | Status: AC
  Administered 2012-07-24: 20 mg via INTRAVENOUS
  Filled 2012-07-24: qty 2

## 2012-07-24 NOTE — Evaluation (Signed)
Physical Therapy Evaluation Patient Details Name: Shane Berry MRN: [id number] DOB: August 16, 1924 Today's Date: 07/24/2012 Time: 8119-1478 PT Time Calculation (min): 25 min  PT Assessment / Plan / Recommendation Clinical Impression  Pt is a pleasent 78 y.o. male s/p pancreatitis and ruptured ulcer.  Pt presents with some deficits in functional mobility secondary to pain/discomfort, decreased activity tolerance, and diminished endurance.  Pt displayed some instability with ambulation and may benefit from use of walker for safety.  Will continue to see pt to address deficits and maximize independence for discharge.     PT Assessment  Patient needs continued PT services    Follow Up Recommendations  No PT follow up                Frequency Min 3X/week    Precautions / Restrictions Precautions Precautions: Other (comment) (Pillow for splinting) Restrictions Weight Bearing Restrictions: No   Pertinent Vitals/Pain No numerical quantification given; "most of the pain is gone"      Mobility  Bed Mobility Bed Mobility: Not assessed (pt received in chair; spouse present) Transfers Transfers: Sit to Stand;Stand to Sit Sit to Stand: 4: Min guard;From chair/3-in-1;With armrests;Without upper extremity assist Stand to Sit: 4: Min guard;To chair/3-in-1;With armrests Ambulation/Gait Ambulation/Gait Assistance: 4: Min guard;5: Supervision (close supervision) Ambulation Distance (Feet): 120 Feet Assistive device: None Ambulation/Gait Assistance Details: Guard secondary to multiple balance checks; no LOB noted.   (Pt does recognize instability with ambulation) Gait Pattern: Step-through pattern;Decreased stride length;Narrow base of support (fwd head; decreased arm swing) Gait velocity: decreased General Gait Details: Pt may benefit from use of rw for stability Stairs: No    Shoulder Instructions     Exercises General Exercises - Lower Extremity Ankle Circles/Pumps: AROM;Both;Other  reps (comment) (continuous for 2 mins) Hip Flexion/Marching: AROM;Both;Other reps (comment) (continuous over 2 minutes)   PT Diagnosis: Difficulty walking;Abnormality of gait;Generalized weakness;Acute pain  PT Problem List: Decreased strength;Decreased activity tolerance;Decreased balance;Decreased mobility;Pain PT Treatment Interventions: Therapeutic exercise;Therapeutic activities;Functional mobility training;Gait training;Patient/family education   PT Goals Acute Rehab PT Goals PT Goal Formulation: With patient/family Time For Goal Achievement: 07/29/12 Potential to Achieve Goals: Good Pt will go Supine/Side to Sit: Independently PT Goal: Supine/Side to Sit - Progress: Goal set today Pt will go Sit to Supine/Side: Independently PT Goal: Sit to Supine/Side - Progress: Goal set today Pt will Stand: Independently PT Goal: Stand - Progress: Goal set today Pt will Ambulate: >150 feet;Independently PT Goal: Ambulate - Progress: Goal set today  Visit Information  Last PT Received On: 07/24/12 Assistance Needed: +1    Subjective Data  Subjective: Most of the pain I had is gone now, I just feel a little winded sometimes Patient Stated Goal: to go home   Prior Functioning  Home Living Lives With: Spouse Available Help at Discharge: Family Type of Home: House Home Access: Level entry Home Layout: One level Bathroom Shower/Tub: Walk-in shower;Tub/shower unit;Door [employer] Toilet: Standard Home Adaptive Equipment: Grab bars in [employer] - rolling;Straight cane;Wheelchair - [employer] - four wheeled;Built-in shower seat Prior Function Level of Independence: Independent Able to Take Stairs?: Yes Driving: Yes Vocation: Retired Dominant Hand: Right    Cognition  Overall Cognitive Status: Appears within functional limits for tasks assessed/performed Arousal/Alertness: Awake/alert Orientation Level: Oriented X4 / Intact;Appears intact for tasks assessed Behavior During  Session: [facility] for tasks performed    Extremity/Trunk Assessment Right Upper Extremity Assessment RUE ROM/Strength/Tone: [facility] for tasks assessed Left Upper Extremity Assessment LUE ROM/Strength/Tone: [facility] for tasks assessed Right Lower Extremity Assessment  RLE ROM/Strength/Tone: [facility] for tasks assessed Left Lower Extremity Assessment LLE ROM/Strength/Tone: [facility] for tasks assessed Trunk Assessment Trunk Assessment: Normal   Balance Balance Balance Assessed: Yes High Level Balance High Level Balance Activites: Sudden stops;Turns;Direction changes;Backward walking;Head turns High Level Balance Comments: Hand held assist for complete 360 degree turn; otherwise min Guard for balance activities  End of Session PT - End of Session Equipment Utilized During Treatment: Gait belt Activity Tolerance: Patient tolerated treatment well Patient left: in chair;with call bell/phone within reach;with family/visitor present Nurse Communication: Mobility status  GP     Fabio Asa 07/24/2012, 11:11 AM  Charlotte Crumb, PT DPT  [phone]

## 2012-07-24 NOTE — Telephone Encounter (Signed)
See note

## 2012-07-24 NOTE — Plan of Care (Signed)
Problem: Phase I Progression Outcomes Goal: OOB as tolerated unless otherwise ordered Outcome: Completed/Met Date Met:  07/24/12 Pt OOB with PT and ambulated Charlotte Crumb, PT DPT  [phone]

## 2012-07-24 NOTE — Progress Notes (Signed)
UR completed 

## 2012-07-24 NOTE — Progress Notes (Signed)
Changed dressing, wet to dry 0605

## 2012-07-24 NOTE — Progress Notes (Signed)
Agree with PA [name]'s PN.  Pt to ambulate TID -con't NGT for today -PO trial in AM

## 2012-07-24 NOTE — Progress Notes (Addendum)
3 Days Post-Op  Subjective: 78 y/o male s/p Exp-Lap with duodenal ulcer repair and graham patch.  The patient states he is doing well today and tolerating the NG tube okay.  He denies any abdominal pain, N/V, chest pain or leg tenderness, but does c/o wheezing and a little SOB.  Pt denies ever being on pulmonary medications.  Pt was up ambulating to the BR and down the halls.    Objective: Vital signs in last 24 hours: Temp:  [98.1 F (36.7 C)-98.6 F (37 C)] 98.6 F (37 C) (10/29 0750) Pulse Rate:  [93-103] 94  (10/29 0750) Resp:  [17-18] 17  (10/29 0750) BP: (148-158)/(75-91) 158/75 mmHg (10/29 0750) SpO2:  [93 %-95 %] 94 % (10/29 0750) Last BM Date: 07/21/12  Intake/Output from previous day: 10/28 0701 - 10/29 0700 In: 2301.7 [I.V.:2061.7] Out: 500 [Emesis/NG output:500] Intake/Output this shift: Total I/O In: 271.7 [I.V.:271.7] Out: 50 [Emesis/NG output:50]  PE: Gen:  Alert, NAD, pleasant Card:  RRR, no murmur heard Pulm:  CTA, mild wheezing and rhonchi b/l Abd: Soft, NT/ND, +BS, midline incision dressing C/D/I Ext:  No erythema or tenderness, +1 pedal edema b/l and +1 on b/l hands  Lab Results:   Basename 07/24/12 0550 07/22/12 0745  WBC 10.9* 11.1*  HGB 11.9* 12.2*  HCT 34.3* 35.9*  PLT 196 188   BMET  Basename 07/24/12 0550 07/22/12 0745  NA 141 138  K 3.3* 4.6  CL 107 107  CO2 19 23  GLUCOSE 81 88  BUN 18 20  CREATININE 0.75 1.00  CALCIUM 8.4 8.3*   PT/INR No results found for this basename: LABPROT:2,INR:2 in the last 72 hours CMP     Component Value Date/Time   NA 141 07/24/2012 0550   K 3.3* 07/24/2012 0550   CL 107 07/24/2012 0550   CO2 19 07/24/2012 0550   GLUCOSE 81 07/24/2012 0550   BUN 18 07/24/2012 0550   CREATININE 0.75 07/24/2012 0550   CALCIUM 8.4 07/24/2012 0550   PROT 6.0 07/21/2012 0330   ALBUMIN 3.3* 07/21/2012 0330   AST 24 07/21/2012 0330   ALT 14 07/21/2012 0330   ALKPHOS 56 07/21/2012 0330   BILITOT 0.5 07/21/2012 0330     GFRNONAA 81* 07/24/2012 0550   GFRAA >90 07/24/2012 0550   Lipase     Component Value Date/Time   LIPASE 158* 07/20/2012 1837       Studies/Results: No results found.  Anti-infectives: Anti-infectives     Start     Dose/Rate Route Frequency Ordered Stop   07/21/12 0600   cefOXitin (MEFOXIN) 2 g in dextrose 5 % 50 mL IVPB  Status:  Discontinued        2 g 100 mL/hr over 30 Minutes Intravenous On call to O.R. 07/21/12 0307 07/21/12 0354   07/21/12 0400   cefOXitin (MEFOXIN) 2 g in dextrose 5 % 50 mL IVPB        2 g 100 mL/hr over 30 Minutes Intravenous Every 6 hours 07/21/12 0307             Assessment/Plan 78 y/o male s/p Exp-Lap with duodenal ulcer repair and graham patch 1.  Cont NPO today and leave NG due to putting out in the last 24 hours, hopefully can discontinue NG tomorrow 2.  Cont. To ambulate OOB 3.  Pending OT/PT note on disposition  SOB, Wheezing, Edema (hands/feet) 1.  Will obtain CXR to ensure he is not fluid overloaded, or bronchitis/pneumonia 2.  May need  to be started on some lasix or breathing treatment 3.  Cont IS!!!   LOS: 4 days    [name], Mattix Imhof 07/24/2012, 10:15 AM Pager: [phone]    Addendum:  Xray shows small b/l pleural effusions with low lung volumes and bibasilar atalectasis.  Will give 20mg  of Lasix to see if breathing and edema in extremities improves.

## 2012-07-24 NOTE — Evaluation (Signed)
Occupational Therapy Evaluation Patient Details Name: Shane Berry MRN: [id number] DOB: May 24, 1924 Today's Date: 07/24/2012    OT Assessment / Plan / Recommendation Clinical Impression  Pt is a pleasent 78 y.o. male s/p pancreatitis and ruptured ulcer.  Pt will benefit from skilled OT to increase I with ADL activity and return to PLOF     OT Assessment  Patient needs continued OT Services    Follow Up Recommendations  Home health OT    Barriers to Discharge            Frequency  Min 3X/week           ADL  Grooming: Simulated;Set up Where Assessed - Grooming: Unsupported sitting Upper Body Bathing: Simulated;Minimal assistance Where Assessed - Upper Body Bathing: Unsupported sitting Lower Body Bathing: Simulated;Maximal assistance Where Assessed - Lower Body Bathing: Supported sit to stand Upper Body Dressing: Simulated;Minimal assistance Where Assessed - Upper Body Dressing: Unsupported sitting Lower Body Dressing: Performed;Maximal assistance Where Assessed - Lower Body Dressing: Supported sit to stand Toilet Transfer: Simulated;Moderate assistance Toilet Transfer Method: Sit to stand Toileting - Clothing Manipulation and Hygiene: Simulated;Moderate assistance Where Assessed - Toileting Clothing Manipulation and Hygiene: Standing Transfers/Ambulation Related to ADLs: Pt fatigued very quickly in standing. ADL Comments: Noted edema in BUe. Educated pt in BUe exercise and positioning to decrease swelling BUE. Will provide UE Exercise handout next OT visit    OT Diagnosis: Generalized weakness  OT Problem List: Decreased strength;Decreased activity tolerance OT Treatment Interventions: Self-care/ADL training;DME and/or AE instruction;Patient/family education;Therapeutic activities;Therapeutic exercise   OT Goals Acute Rehab OT Goals OT Goal Formulation: With patient ADL Goals Pt Will Perform Grooming: with supervision;Standing at sink ADL Goal: Grooming - Progress:  Goal set today Pt Will Perform Lower Body Bathing: with supervision;with adaptive equipment;Sit to stand from chair ADL Goal: Lower Body Bathing - Progress: Goal set today Pt Will Transfer to Toilet: with supervision;Comfort height toilet ADL Goal: Toilet Transfer - Progress: Goal set today ADL Goal: Additional Goal #1 - Progress: Goal set today  Visit Information  Last OT Received On: 07/24/12    Subjective Data  Subjective: little bits of activity really get me winded   Prior Functioning     Home Living Lives With: Spouse Available Help at Discharge: Family Type of Home: House Home Access: Level entry Home Layout: One level Bathroom Shower/Tub: Walk-in shower;Tub/shower unit;Door [employer] Toilet: Standard Home Adaptive Equipment: Grab bars in [employer] - rolling;Straight cane;Wheelchair - [employer] - four wheeled;Built-in shower seat Prior Function Level of Independence: Independent Able to Take Stairs?: Yes Driving: Yes Vocation: Retired Dominant Hand: Right            Cognition  Overall Cognitive Status: Appears within functional limits for tasks assessed/performed Arousal/Alertness: Awake/alert Orientation Level: Oriented X4 / Intact;Appears intact for tasks assessed Behavior During Session: [facility] for tasks performed    Extremity/Trunk Assessment Right Upper Extremity Assessment RUE ROM/Strength/Tone: [facility] for tasks assessed Left Upper Extremity Assessment LUE ROM/Strength/Tone: WFL for tasks assessed     Mobility Transfers Transfers: Sit to Stand Sit to Stand: 4: Min assist;From chair/3-in-1 Stand to Sit: 4: Min assist;To chair/3-in-1              End of Session OT - End of Session Activity Tolerance: Patient limited by fatigue Patient left: in chair  GO     Shane Berry, Metro Kung 07/24/2012, 3:22 PM

## 2012-07-25 NOTE — Progress Notes (Signed)
Patient ID: Shane Berry, male   DOB: [birth year], 78 y.o.   MRN: [id number] 4 Days Post-Op  Subjective: 78 y/o male s/p Exp-Lap with duodenal ulcer repair and graham patch. The patient states he is doing well today, but is having trouble sleeping.  He denies any abdominal pain, N/V, chest pain or leg tenderness.  SOB/wheezing and extremity swelling is improved today.  Pt was not seen by PT yesterday, but was told they would see him today.  He was upset he wasn't walked at all yesterday.     Objective: Vital signs in last 24 hours: Temp:  [97.9 F (36.6 C)-98.1 F (36.7 C)] 98.1 F (36.7 C) (10/30 0835) Pulse Rate:  [81-87] 81  (10/30 0835) Resp:  [15-18] 16  (10/30 0835) BP: (151-169)/(66-80) 151/66 mmHg (10/30 0835) SpO2:  [96 %-97 %] 97 % (10/30 0835) Last BM Date: 07/21/12  Intake/Output from previous day: 10/29 0701 - 10/30 0700 In: 2371.7 [I.V.:1971.7; IV Piggyback:400] Out: 2090 [Urine:1190; Emesis/NG output:900] Intake/Output this shift: Total I/O In: 200 [I.V.:200] Out: 300 [Urine:150; Emesis/NG output:150]  PE: Gen: Alert, NAD, pleasant  Card: RRR, no murmur heard  Pulm: CTA, no wheezing or rhonchi Abd: Soft, NT/ND, +BS, midline incision dressing C/D/I  Ext: No erythema or tenderness, extremity edema slightly improved from yesterday  Lab Results:   Basename 07/24/12 0550  WBC 10.9*  HGB 11.9*  HCT 34.3*  PLT 196   BMET  Basename 07/24/12 0550  NA 141  K 3.3*  CL 107  CO2 19  GLUCOSE 81  BUN 18  CREATININE 0.75  CALCIUM 8.4   PT/INR No results found for this basename: LABPROT:2,INR:2 in the last 72 hours CMP     Component Value Date/Time   NA 141 07/24/2012 0550   K 3.3* 07/24/2012 0550   CL 107 07/24/2012 0550   CO2 19 07/24/2012 0550   GLUCOSE 81 07/24/2012 0550   BUN 18 07/24/2012 0550   CREATININE 0.75 07/24/2012 0550   CALCIUM 8.4 07/24/2012 0550   PROT 6.0 07/21/2012 0330   ALBUMIN 3.3* 07/21/2012 0330   AST 24 07/21/2012 0330   ALT 14  07/21/2012 0330   ALKPHOS 56 07/21/2012 0330   BILITOT 0.5 07/21/2012 0330   GFRNONAA 81* 07/24/2012 0550   GFRAA >90 07/24/2012 0550   Lipase     Component Value Date/Time   LIPASE 158* 07/20/2012 1837       Studies/Results: Dg Chest 2 View  07/24/2012  *RADIOLOGY REPORT*  Clinical Data: Wheezing with rhonchi and swelling in the hands and feet  CHEST - 2 VIEW  Comparison: 10/28/2005  Findings: The patient is status post median sternotomy and CABG.  A nasogastric tube is in place with the tip located in the region of the mid body of the stomach.  Cardiomegaly is identified and appears unchanged in degree.  Ectasia of the thoracic aorta is also noted and appears stable.  Bilateral pleural effusions are noted with overall low lung volumes and some associated bibasilar atelectasis.  No significant peribronchial fluid or interstitial septal lines are identified to suggest current interstitial edema.  Bony structures are notable for degenerative change of the lumbar spine.  IMPRESSION: Stable cardiomegaly and aortic ectasia. Small bilateral pleural effusions with low lung volumes and bibasilar density felt most likely to represent bibasilar atelectasis.  No signs of current interstitial edema.   Original Report Authenticated By: Bertha Stakes, M.D.     Anti-infectives: Anti-infectives     Start  Dose/Rate Route Frequency Ordered Stop   07/21/12 0600   cefOXitin (MEFOXIN) 2 g in dextrose 5 % 50 mL IVPB  Status:  Discontinued        2 g 100 mL/hr over 30 Minutes Intravenous On call to O.R. 07/21/12 0307 07/21/12 0354   07/21/12 0400   cefOXitin (MEFOXIN) 2 g in dextrose 5 % 50 mL IVPB        2 g 100 mL/hr over 30 Minutes Intravenous Every 6 hours 07/21/12 0307             Assessment/Plan 78 y/o male s/p Exp-Lap with duodenal ulcer repair and graham patch  1. D/C NG tube and start clear liquid diet, if tolerating tomorrow can start full liquid diet  2. Cont. To ambulate OOB  TID down the halls 3. Pending OT/PT note on disposition (was not seen yesterday) 4.  Hoping he can go home on Friday if tolerating regular diet 5.  Will change to PO meds tomorrow if tolerating liquids  SOB & wheezing (resolved), Edema of hands/feet improved 1. Cont IS and ambulation which will help to resolve     LOS: 5 days    [name], Shane Berry 07/25/2012, 9:08 AM Pager: [phone]

## 2012-07-25 NOTE — Progress Notes (Signed)
Occupational Therapy Treatment Patient Details Name: Shane Berry MRN: [id number] DOB: 04/08/1924 Today's Date: 07/25/2012 Time: 2952-8413 OT Time Calculation (min): 17 min  OT Assessment / Plan / Recommendation Comments on Treatment Session improved endurance today and decrease edema in BUE                   Plan Discharge plan remains appropriate           ADL  Toilet Transfer: Performed;Min guard Toilet Transfer Method: Sit to Barista: Other (comment) (sit to stand for use of the urinal) Toileting - Architect and Hygiene: Min guard Where Assessed - Toileting Clothing Manipulation and Hygiene: Standing Transfers/Ambulation Related to ADLs: Pt with improved endurance today and really wanted to go on a walk.  Discussed importance of rest breaks and energy conservation straregies during walk on unit.  Pt did recognize he needed a rest break ADL Comments: Pt reported he had already done arm exerccise today. Swelling significantly decreased.  Pt comfortable with arm exercise.      OT Goals ADL Goals ADL Goal: Toilet Transfer - Progress: Other (comment) (currently min guard assist)  Visit Information  Last OT Received On: 07/25/12    Subjective Data  Patient Stated Goal: I just wanna go for a walk      Cognition  Overall Cognitive Status: Appears within functional limits for tasks assessed/performed Arousal/Alertness: Awake/alert Orientation Level: Oriented X4 / Intact;Appears intact for tasks assessed Behavior During Session: [facility] for tasks performed    Mobility  Shoulder Instructions Transfers Transfers: Sit to Stand;Stand to Sit Sit to Stand: 4: Min guard;With upper extremity assist Stand to Sit: 4: Min guard;With upper extremity assist             End of Session OT - End of Session Activity Tolerance: Patient tolerated treatment well Patient left: in chair;with call bell/phone within reach;with family/visitor present        Alba Cory 07/25/2012, 3:44 PM

## 2012-07-26 MED ORDER — PANTOPRAZOLE SODIUM 40 MG PO TBEC
40.0000 mg | DELAYED_RELEASE_TABLET | Freq: Two times a day (BID) | ORAL | Status: DC
  Administered 2012-07-26 – 2012-07-27 (×2): 40 mg via ORAL
  Filled 2012-07-26 (×2): qty 1

## 2012-07-26 MED ORDER — HYDROCODONE-ACETAMINOPHEN 5-325 MG PO TABS
1.0000 | ORAL_TABLET | ORAL | Status: DC | PRN
  Administered 2012-07-26: 1 via ORAL
  Filled 2012-07-26: qty 1

## 2012-07-26 MED ORDER — INDOMETHACIN 50 MG PO CAPS
50.0000 mg | ORAL_CAPSULE | Freq: Three times a day (TID) | ORAL | Status: DC
  Administered 2012-07-26: 50 mg via ORAL
  Filled 2012-07-26 (×5): qty 1

## 2012-07-26 MED ORDER — SODIUM CHLORIDE 0.9 % IV SOLN: 1000.0000 mL | INTRAVENOUS | Status: DC

## 2012-07-26 MED ORDER — OMEPRAZOLE 40 MG PO CPDR: 40.0000 mg | DELAYED_RELEASE_CAPSULE | Freq: Two times a day (BID) | ORAL | Status: DC

## 2012-07-26 MED ORDER — CIPROFLOXACIN HCL 500 MG PO TABS
500.0000 mg | ORAL_TABLET | Freq: Two times a day (BID) | ORAL | Status: DC
  Administered 2012-07-26 – 2012-07-27 (×3): 500 mg via ORAL
  Filled 2012-07-26 (×5): qty 1

## 2012-07-26 NOTE — Discharge Summary (Signed)
Physician Discharge Summary  Patient ID: Shane Berry MRN: [id number] DOB/AGE: 76-Oct-1925 76 y.o.  Admit date: 07/20/2012 Discharge date: 07/26/2012  Admitting Diagnosis: Peritonitis  Discharge Diagnosis Patient Active Problem List   Diagnosis Date Noted  . Duodenal ulcer perforation 07/20/2012    Priority: High    Class: Acute    Consultants None  Imaging: CT Abdomen- IMPRESSION: 1. Intraperitoneal free air and fluid in the upper abdomen suggests proximal GI perforation. Favor that the fundus of the stomach as likely source of perforation. A second consideration would be along the medial margin of the second portion the duodenum although this is more likely a diverticulum. 2. Significant coronary artery calcifications.  Procedures Exploratory laparotomy with closure of duodenal ulcer using grams patch on 07/21/12  Hospital Course:  78 y/o male who presented to [facility] on 07/20/12 with 1 week history of severe epigastric abdominal pain which worsened acutely today after starting indocin for possible gout.  Workup showed Intraperitoneal free air and fluid in the upper abdomen suggestive of proximal GI perforation.  Patient was admitted and underwent procedure listed above.  Tolerated procedure well and was transferred to the floor.  Diet was advanced as tolerated.  The patient is currently having another gout attack, but is unable to start on anti-inflammatory's because of recent GI perforation.  On POD 6, the patient was voiding well, tolerating diet, ambulating with assistance, pain well controlled, vital signs stable, incisions c/d/i and felt stable for discharge to SNF for rehab.  Patient will follow up in our office in 3 weeks and knows to call with questions or concerns.  Physical Exam: General:  Alert, NAD, pleasant, comfortable Abd:  Soft, ND, mild tenderness, incisions C/D/I    Medication List     As of 07/26/2012 10:33 AM    STOP taking these medications        aspirin 325 MG tablet      indomethacin 50 MG capsule   Commonly known as: INDOCIN      terbinafine 250 MG tablet   Commonly known as: LAMISIL      TAKE these medications         atorvastatin 10 MG tablet   Commonly known as: LIPITOR   Take 10 mg by mouth daily.      multivitamin with minerals Tabs   Take 1 tablet by mouth daily.      omeprazole 40 MG capsule   Commonly known as: PRILOSEC   Take 1 capsule (40 mg total) by mouth 2 (two) times daily.             Follow-up Information    Follow up with [facility] In 3 weeks. (Our office will call you with the appt time with Dr. Luisa Hart)    Contact information:   [address] [place] [address] [phone]         Also make appts with your primary care provider to follow up after your hospitalization and your orthopedic doctor regarding your gout.  It is not recommended that you take any form of anti-inflammatory (Advil, Aleeve, Ibuprofen, Motrin Celebrex, Indomethocin, etc.) given your history of gastrointestinal perforation.   Signed: Aris Georgia, [facility] [phone]  07/26/2012, 10:33 AM

## 2012-07-26 NOTE — Progress Notes (Signed)
Nutrition Education Note  RD consulted at pt request for diet education r/t duodenal ulcer.  RD provided "Gastroesophageal Reflux Nutrition Therapy" handout to pt and wife.  Discussed the current research- based evidence for nutrition therapy r/t duodenal ulcers which recommend limited restrictions with medication management. RD encouraged a well-balanced diet of fruit, vegetables, and whole grains.  Encouraged bland foods and limited caffeine. RD did discuss the relevant material in the handout concerning ways to decrease gastric acid secretion.  Pt continues to request a "diet" as he feels that he already eats a diet compliant with above recommendations (with exception of eating tomatoes/tomato products and tea).  Pt reports his daughter has already found some diets on the Internet that he plans to try, but has not been able to discuss them with her yet.  RD encouraged pt to make slow changes and start with bland food and limited caffeine (from tea), as well as MD's recommendation to limit tomatoes/products.  Provided with name and contact information should pt wish to speak with me after talking with daughter about the information she has found.  Expect good compliance.  Body mass index is 29.93 kg/(m^2). Pt meets criteria for overweight based on current BMI.  Current diet order is Regular, patient is consuming approximately 20-50% of meals at this time. Additional labs and medications reviewed.   No further nutrition interventions warranted at this time. RD contact information provided. If additional nutrition issues arise, please re-consult RD.  Loyce Dys, MS RD LDN Clinical Inpatient Dietitian Pager: [phone] Weekend/After hours pager: [phone]

## 2012-07-26 NOTE — Progress Notes (Signed)
Patient claimed he ia having hard to urinating, bladder scan shows about 250 urine in the bladder, encourage patient to drink more fluids, will endorse accordingly.

## 2012-07-26 NOTE — Clinical Social Work Psychosocial (Signed)
     Clinical Social Work Department BRIEF PSYCHOSOCIAL ASSESSMENT 07/26/2012  Patient:  Shane Berry, Shane Berry     Account Number:  [id number]     Admit date:  07/20/2012  Clinical Social Worker:  Hulan Fray  Date/Time:  07/26/2012 11:38 AM  Referred by:  RN  Date Referred:  07/26/2012 Referred for  SNF Placement   Other Referral:   Interview type:  Patient Other interview type:   daughter- Epimenio Foot ([phone]    PSYCHOSOCIAL DATA Living Status:  WIFE Admitted from facility:   Level of care:   Primary support name:  Braun Rocca and Epimenio Foot Primary support relationship to patient:  FAMILY Degree of support available:   supportive    CURRENT CONCERNS Current Concerns  Post-Acute Placement   Other Concerns:    SOCIAL WORK ASSESSMENT / PLAN Clinical Social Worker received referral for SNF placement for patient. CSW and CM introduced selves and explained reason for visit. CM and CSW explained the home health and SNF process to both. Patient's daughter, Elease Hashimoto was at bedside. They expressed their concerns with the anticipated discharge tomorrow and making sure the patient will be safe. Per Elease Hashimoto, the patient's wife has her own physical limitations regarding caring for the patient. Patient expressed interest in short term SNF placement "for about a week." Both patient and daughter are in agreement to pursue SNF placement. CSW provided SNF packet and will initiate SNF search in [place]. CSW will complete FL2 for MD's signature and update patient and daughter when bed offers are made.   Assessment/plan status:  Psychosocial Support/Ongoing Assessment of Needs Other assessment/ plan:   Information/referral to community resources:   SNF packet    PATIENTS/FAMILYS RESPONSE TO PLAN OF CARE: Patient and daughter are agreeable to short term SNF placement at discharge.

## 2012-07-26 NOTE — Progress Notes (Addendum)
Physical Therapy Treatment Patient Details Name: Shane Berry MRN: [id number] DOB: October 11, 1923 Today's Date: 07/26/2012 Time: 5621-3086 PT Time Calculation (min): 21 min  PT Assessment / Plan / Recommendation Comments on Treatment Session  Patient has been ambulating with his daughter. 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     Follow Up Recommendations  Post acute inpatient     Does the patient have the potential to tolerate intense rehabilitation  No, Recommend SNF  Barriers to Discharge        Equipment Recommendations       Recommendations for Other Services    Frequency Min 3X/week   Plan      Precautions / Restrictions     Pertinent Vitals/Pain     Mobility  Bed Mobility Bed Mobility: Not assessed Transfers Sit to Stand: 5: Supervision;From toilet;With upper extremity assist Stand to Sit: 5: Supervision;To chair/3-in-1;With upper extremity assist Ambulation/Gait Ambulation/Gait Assistance: 5: Supervision Ambulation Distance (Feet): 600 Feet Assistive device: Rolling walker Ambulation/Gait Assistance Details: Cues for posture and RW management Gait Pattern: Step-through pattern    Exercises     PT Diagnosis:    PT Problem List:   PT Treatment Interventions:     PT Goals Acute Rehab PT Goals PT Goal: Stand - Progress: Progressing toward goal PT Goal: Ambulate - Progress: Progressing toward goal  Visit Information  Last PT Received On: 07/26/12 Assistance Needed: +1    Subjective Data      Cognition  Overall Cognitive Status: Appears within functional limits for tasks assessed/performed Arousal/Alertness: Awake/alert Orientation Level: Appears intact for tasks assessed Behavior During Session: [facility] for tasks performed    Balance     End of Session PT - End of  Session Equipment Utilized During Treatment: Gait belt Activity Tolerance: Patient tolerated treatment well Patient left: with call bell/phone within reach;in chair Nurse Communication: Mobility status   GP     Fredrich Birks 07/26/2012, 12:05 PM 07/26/2012 Fredrich Birks PTA [phone] pager [phone] office

## 2012-07-26 NOTE — Progress Notes (Signed)
Agree with discharge recommendation update Charlotte Crumb, PT DPT  [phone]

## 2012-07-26 NOTE — Clinical Social Work Placement (Addendum)
    Clinical Social Work Department CLINICAL SOCIAL WORK PLACEMENT NOTE 07/26/2012  Patient:  Shane Berry, Shane Berry  Account Number:  [id number] Admit date:  07/20/2012  Clinical Social Worker:  Hulan Fray  Date/time:  07/26/2012 12:10 PM  Clinical Social Work is seeking post-discharge placement for this patient at the following level of care:   SKILLED NURSING   (*CSW will update this form in Epic as items are completed)   07/26/2012  Patient/family provided with [facility] Department of Clinical Social Work's list of facilities offering this level of care within the geographic area requested by the patient (or if unable, by the patient's family).  07/26/2012  Patient/family informed of their freedom to choose among providers that offer the needed level of care, that participate in Medicare, Medicaid or managed care program needed by the patient, have an available bed and are willing to accept the patient.  07/26/2012  Patient/family informed of [facility]' ownership interest in [facility], as well as of the fact that they are under no obligation to receive care at this facility.  PASARR submitted to EDS on 07/26/2012 PASARR number received from EDS on 07/26/2012  FL2 transmitted to all facilities in geographic area requested by pt/family on  07/26/2012 FL2 transmitted to all facilities within larger geographic area on   Patient informed that his/her managed care company has contracts with or will negotiate with  certain facilities, including the following:     Patient/family informed of bed offers received:  07-26-12 Patient chooses bed at [facility] Physician recommends and patient chooses bed at    Patient to be transferred to [facility] on  07-27-12 Patient to be transferred to facility by [facility] vehicle  The following physician request were entered in Epic:   Additional Comments:

## 2012-07-26 NOTE — Progress Notes (Signed)
Patient ID: Shane Berry, male   DOB: [birth year], 78 y.o.   MRN: [id number] 5 Days Post-Op  Subjective: Pt doing very well today, up ambulating with assistance.  Tolerating liquid diet, no BM yet, but urinating regularly.  Pt denies any pain and extremity swelling is improving.  Denies any pain, SOB or chest pain.  Objective: Vital signs in last 24 hours: Temp:  [97.5 F (36.4 C)-98.7 F (37.1 C)] 98.7 F (37.1 C) (10/31 0830) Pulse Rate:  [72-89] 79  (10/31 0830) Resp:  [18-19] 19  (10/31 0830) BP: (124-175)/(64-84) 124/74 mmHg (10/31 0830) SpO2:  [96 %-98 %] 98 % (10/31 0830) Last BM Date: 07/21/12  Intake/Output from previous day: 10/30 0701 - 10/31 0700 In: 3458.3 [P.O.:910; I.V.:2298.3; IV Piggyback:250] Out: 500 [Urine:350; Emesis/NG output:150] Intake/Output this shift:    PE: Gen:  Alert, NAD, pleasant Abd: Soft, NT/ND, +BS, incision C/D/I Ext:  Less extremity edema today  Lab Results:   Basename 07/24/12 0550  WBC 10.9*  HGB 11.9*  HCT 34.3*  PLT 196   BMET  Basename 07/24/12 0550  NA 141  K 3.3*  CL 107  CO2 19  GLUCOSE 81  BUN 18  CREATININE 0.75  CALCIUM 8.4   PT/INR No results found for this basename: LABPROT:2,INR:2 in the last 72 hours CMP     Component Value Date/Time   NA 141 07/24/2012 0550   K 3.3* 07/24/2012 0550   CL 107 07/24/2012 0550   CO2 19 07/24/2012 0550   GLUCOSE 81 07/24/2012 0550   BUN 18 07/24/2012 0550   CREATININE 0.75 07/24/2012 0550   CALCIUM 8.4 07/24/2012 0550   PROT 6.0 07/21/2012 0330   ALBUMIN 3.3* 07/21/2012 0330   AST 24 07/21/2012 0330   ALT 14 07/21/2012 0330   ALKPHOS 56 07/21/2012 0330   BILITOT 0.5 07/21/2012 0330   GFRNONAA 81* 07/24/2012 0550   GFRAA >90 07/24/2012 0550   Lipase     Component Value Date/Time   LIPASE 158* 07/20/2012 1837       Studies/Results: Dg Chest 2 View  07/24/2012  *RADIOLOGY REPORT*  Clinical Data: Wheezing with rhonchi and swelling in the hands and feet  CHEST  - 2 VIEW  Comparison: 10/28/2005  Findings: The patient is status post median sternotomy and CABG.  A nasogastric tube is in place with the tip located in the region of the mid body of the stomach.  Cardiomegaly is identified and appears unchanged in degree.  Ectasia of the thoracic aorta is also noted and appears stable.  Bilateral pleural effusions are noted with overall low lung volumes and some associated bibasilar atelectasis.  No significant peribronchial fluid or interstitial septal lines are identified to suggest current interstitial edema.  Bony structures are notable for degenerative change of the lumbar spine.  IMPRESSION: Stable cardiomegaly and aortic ectasia. Small bilateral pleural effusions with low lung volumes and bibasilar density felt most likely to represent bibasilar atelectasis.  No signs of current interstitial edema.   Original Report Authenticated By: Bertha Stakes, M.D.     Anti-infectives: Anti-infectives     Start     Dose/Rate Route Frequency Ordered Stop   07/26/12 0915   ciprofloxacin (CIPRO) tablet 500 mg        500 mg Oral 2 times daily 07/26/12 0910     07/21/12 0600   cefOXitin (MEFOXIN) 2 g in dextrose 5 % 50 mL IVPB  Status:  Discontinued        2 g  100 mL/hr over 30 Minutes Intravenous On call to O.R. 07/21/12 0307 07/21/12 0354   07/21/12 0400   cefOXitin (MEFOXIN) 2 g in dextrose 5 % 50 mL IVPB  Status:  Discontinued        2 g 100 mL/hr over 30 Minutes Intravenous Every 6 hours 07/21/12 0307 07/26/12 0910           Assessment/Plan S/P Exp-Lap with duodenal ulcer repair and graham patch  1. Progress to full liquid diet 2. Cont. To ambulate OOB TID down the halls  3. OT recommending home health, I would recommend pt have home PT as well to eval for safety since PT is concerned about his mobility and safety at home and wife is unable to assist, daughter would like to meet with care manager about home health 4. Plan for D/C home tomorrow if  tolerating reg diet 5. IV to PO meds today  SOB & wheezing (resolved), Edema of hands/feet improved  1. Cont IS and ambulation which will help to resolve 2.  Saline lock when tolerating fulls       LOS: 6 days    [name], Severus Brodzinski 07/26/2012, 9:11 AM Pager: [phone]

## 2012-07-26 NOTE — Care Management Note (Signed)
  Page 1 of 1   07/26/2012     10:42:33 AM   CARE MANAGEMENT NOTE 07/26/2012  Patient:  KERRIE, LATOUR   Account Number:  [id number]  Date Initiated:  07/24/2012  Documentation initiated by:  Carlyle Lipa  Subjective/Objective Assessment:   perforated ulcer requiring surgical repair     Action/Plan:   home when medically stable   Anticipated DC Date:  07/27/2012   Anticipated DC Plan:  SKILLED NURSING FACILITY  In-house referral  Clinical Social Worker      DC Planning Services  CM consult      Choice offered to / List presented to:             Status of service:  In process, will continue to follow Medicare Important Message given?   (If response is "NO", the following Medicare IM given date fields will be blank) Date Medicare IM given:   Date Additional Medicare IM given:    Discharge Disposition:    Per UR Regulation:  Reviewed for med. necessity/level of care/duration of stay  If discussed at Long Length of Stay Meetings, dates discussed:    Comments:  07-26-12 Social worker and NCM spoke with patient and patient's daughter at bedside this am . Patient has decided to go to short term rehab , before going home.  Social worker has started progress . Patient and daughter aware discharge is planned for 07-27-12 and will decide on bed this evening or first thing in am .  Ronny Flurry RN BS [phone]

## 2012-07-26 NOTE — Progress Notes (Signed)
MD made of patient concern ,right toe swollen and red,tender, warm to touch.

## 2012-07-27 ENCOUNTER — Telehealth (INDEPENDENT_AMBULATORY_CARE_PROVIDER_SITE_OTHER): Payer: Self-pay

## 2012-07-27 MED ORDER — CIPROFLOXACIN HCL 500 MG PO TABS: 500.0000 mg | ORAL_TABLET | Freq: Two times a day (BID) | ORAL | Status: DC

## 2012-07-27 MED ORDER — ASPIRIN 81 MG PO TABS: ORAL_TABLET | ORAL | Status: DC

## 2012-07-27 MED ORDER — OMEPRAZOLE 40 MG PO CPDR: 40.0000 mg | DELAYED_RELEASE_CAPSULE | Freq: Two times a day (BID) | ORAL | Status: DC

## 2012-07-27 MED ORDER — HYDROCODONE-ACETAMINOPHEN 5-325 MG PO TABS: 1.0000 | ORAL_TABLET | ORAL | Status: DC | PRN

## 2012-07-27 NOTE — Progress Notes (Signed)
Discharge to SNF, report given to Ilene Qua, RN

## 2012-07-27 NOTE — Telephone Encounter (Signed)
Called patient to give appt info. No answer or VM available. I will continue to call patient.

## 2012-07-27 NOTE — Clinical Social Work Note (Signed)
Clinical Social Worker facilitated discharge by contacting family, daughter and facility, [facility] regarding discharge today. Patient's discharge packet will be placed with shadow chart. Patient will be transported by family to SNF. CSW will sign off, as social work intervention is no longer needed.   Rozetta Nunnery MSW, [employer] [phone]

## 2012-07-30 ENCOUNTER — Telehealth (INDEPENDENT_AMBULATORY_CARE_PROVIDER_SITE_OTHER): Payer: Self-pay

## 2012-07-30 NOTE — Telephone Encounter (Signed)
Tried calling patient back but still no answer and no VM available. Please give patient appt info should he call back.

## 2012-07-30 NOTE — Telephone Encounter (Signed)
Message copied by Brennan Bailey on Mon Jul 30, 2012  9:08 AM ------      Message from: [place], [place]      Created: Thu Jul 26, 2012 10:31 AM                   ----- Message -----         From: Aris Georgia, PA-C         Sent: 07/26/2012  10:24 AM           To: [facility] Clinical Pool            Please schedule the patient for a 2-3 week f/u with Dr. Luisa Hart, s/p perforated duodenal ulcer, Exploratory laparotomy with closure of duodenal ulcer using grams patch.      Pt will be discharged tomorrow.

## 2012-08-06 ENCOUNTER — Ambulatory Visit (INDEPENDENT_AMBULATORY_CARE_PROVIDER_SITE_OTHER): Payer: Medicare Other | Admitting: Surgery

## 2012-08-06 ENCOUNTER — Encounter (INDEPENDENT_AMBULATORY_CARE_PROVIDER_SITE_OTHER): Payer: Self-pay | Admitting: Surgery

## 2012-08-06 VITALS — BP 134/70 | HR 92 | Temp 98.6°F | Ht 66.0 in | Wt 180.2 lb

## 2012-08-06 DIAGNOSIS — Z9889 Other specified postprocedural states: Secondary | ICD-10-CM | POA: Insufficient documentation

## 2012-08-06 NOTE — Progress Notes (Signed)
Patient returns after exploratory laparotomy with closure of perforated duodenal ulcer. He is 6 weeks out and doing well. He is in assisted living but to go home soon. He was on NSAID medication for gout. He is now on omeprazole.  Exam: Midline with opening clean. This is being packed. Soft nontender  Impression: Status post exploratory laparotomy with closure of perforated duodenal ulcer doing well  Plan: He is to go back and see Dr. Valentina Lucks for gout management. No more NSAID use. He does have some residual pedal edema and fatigue initially get better with time. Return one month.

## 2012-08-06 NOTE — Patient Instructions (Signed)
Return 1 month. Need to follow up with DR GRIFFIN for gout.

## 2012-08-14 ENCOUNTER — Telehealth (INDEPENDENT_AMBULATORY_CARE_PROVIDER_SITE_OTHER): Payer: Self-pay

## 2012-08-14 NOTE — Telephone Encounter (Signed)
Shane Berry from [facility] called to report that the pt's midline wound is now draining brown/yellow.  Some hypergranulation.  The residing MD would like to put the pt on an antibiotic prior to d/c, but wanted Dr. Luisa Hart to know.  Does pt need to come in sooner than Dec. appt for another wound check?

## 2012-08-14 NOTE — Telephone Encounter (Signed)
sure

## 2012-08-15 NOTE — Telephone Encounter (Signed)
Called Marisue Ivan back to make an appointment for torion to come in either Friday or Monday. Left message for her to call back to make that appointment.

## 2012-08-17 ENCOUNTER — Encounter (INDEPENDENT_AMBULATORY_CARE_PROVIDER_SITE_OTHER): Payer: Self-pay | Admitting: Surgery

## 2012-08-17 ENCOUNTER — Ambulatory Visit (INDEPENDENT_AMBULATORY_CARE_PROVIDER_SITE_OTHER): Payer: Medicare Other | Admitting: Surgery

## 2012-08-17 VITALS — BP 96/62 | HR 60 | Temp 97.3°F | Resp 16 | Ht 66.0 in | Wt 169.6 lb

## 2012-08-17 DIAGNOSIS — Z9889 Other specified postprocedural states: Secondary | ICD-10-CM

## 2012-08-17 NOTE — Patient Instructions (Signed)
Return as scheduled.  OK to drive and resume full activity

## 2012-08-17 NOTE — Progress Notes (Signed)
Patient returns after closure of duodenal ulcer. He is doing well.  Exam he has some excessive granulation tissue treated with silver nitrate.  Impression: Status post closure of duodenal ulcer doing well plan return in 2 weeks. He will likely. He'll need endoscopy sometime next year in followup.

## 2012-08-28 ENCOUNTER — Encounter (INDEPENDENT_AMBULATORY_CARE_PROVIDER_SITE_OTHER): Payer: Self-pay | Admitting: Surgery

## 2012-08-28 ENCOUNTER — Ambulatory Visit (INDEPENDENT_AMBULATORY_CARE_PROVIDER_SITE_OTHER): Payer: Medicare Other | Admitting: Surgery

## 2012-08-28 VITALS — BP 126/74 | HR 74 | Temp 97.0°F | Resp 18 | Ht 66.5 in | Wt 170.2 lb

## 2012-08-28 DIAGNOSIS — Z9889 Other specified postprocedural states: Secondary | ICD-10-CM

## 2012-08-28 NOTE — Progress Notes (Signed)
Patient returns in followup of his duodenal ulcer. He is doing well. On exam, his incision is well-healed. At this point in time I will release him from my care. I will refer medial GI for followup for potential upper endoscopy in a few months to ensure proper healing.

## 2012-08-28 NOTE — Patient Instructions (Signed)
Will refer to [facility] for follow up PUD.  Return as needed

## 2012-09-05 ENCOUNTER — Telehealth (INDEPENDENT_AMBULATORY_CARE_PROVIDER_SITE_OTHER): Payer: Self-pay

## 2012-09-05 NOTE — Telephone Encounter (Signed)
[facility] called to inform us pt is discharged. Wound completely healed.

## 2012-12-25 ENCOUNTER — Encounter (INDEPENDENT_AMBULATORY_CARE_PROVIDER_SITE_OTHER): Payer: Self-pay

## 2013-06-25 ENCOUNTER — Encounter: Payer: Self-pay | Admitting: Interventional Cardiology

## 2013-06-25 ENCOUNTER — Encounter: Payer: Self-pay | Admitting: *Deleted

## 2013-06-25 DIAGNOSIS — E785 Hyperlipidemia, unspecified: Secondary | ICD-10-CM | POA: Insufficient documentation

## 2013-06-25 DIAGNOSIS — R011 Cardiac murmur, unspecified: Secondary | ICD-10-CM | POA: Insufficient documentation

## 2013-06-25 DIAGNOSIS — I219 Acute myocardial infarction, unspecified: Secondary | ICD-10-CM | POA: Insufficient documentation

## 2013-07-02 ENCOUNTER — Encounter: Payer: Self-pay | Admitting: Interventional Cardiology

## 2013-07-02 ENCOUNTER — Ambulatory Visit (INDEPENDENT_AMBULATORY_CARE_PROVIDER_SITE_OTHER): Payer: PRIVATE HEALTH INSURANCE | Admitting: Interventional Cardiology

## 2013-07-02 VITALS — BP 128/70 | HR 69 | Ht 68.0 in | Wt 166.8 lb

## 2013-07-02 DIAGNOSIS — E785 Hyperlipidemia, unspecified: Secondary | ICD-10-CM

## 2013-07-02 DIAGNOSIS — I2510 Atherosclerotic heart disease of native coronary artery without angina pectoris: Secondary | ICD-10-CM

## 2013-07-02 DIAGNOSIS — I495 Sick sinus syndrome: Secondary | ICD-10-CM | POA: Insufficient documentation

## 2013-07-02 DIAGNOSIS — I25708 Atherosclerosis of coronary artery bypass graft(s), unspecified, with other forms of angina pectoris: Secondary | ICD-10-CM | POA: Insufficient documentation

## 2013-07-02 MED ORDER — ASPIRIN 81 MG PO TABS: 81.0000 mg | ORAL_TABLET | ORAL | Status: DC

## 2013-07-02 NOTE — Progress Notes (Signed)
Patient ID: Shane Berry, male   DOB: Jan 19, 1924, 79 y.o.   MRN: [id number]    HPI  He had duodenal ulcer in October of 2013. No cardiac complaints.  He has not fallen or had palpitations. He denies transient neurological complaints. There are no medication side effects. It is felt that the ulcers were due to anti-inflammatories .  Allergies  Allergen Reactions  . Nsaids Other (See Comments)    GI perforation    Current Outpatient Prescriptions  Medication Sig Dispense Refill  . aspirin 81 MG tablet Take 81 mg by mouth daily.      Marland Kitchen atorvastatin (LIPITOR) 10 MG tablet Take 10 mg by mouth daily.      . Multiple Vitamin (MULTIVITAMIN WITH MINERALS) TABS Take 1 tablet by mouth daily.      Marland Kitchen omeprazole (PRILOSEC) 40 MG capsule Take 40 mg by mouth daily.       No current facility-administered medications for this visit.    Past Medical History  Diagnosis Date  . Heart murmur   . Hyperlipidemia   . Heart attack   . Duodenal ulcer perforation 07/20/2012  . HTN (hypertension)     Past Surgical History  Procedure Laterality Date  . Coronary artery bypass graft    . Laparotomy  07/21/2012    Procedure: EXPLORATORY LAPAROTOMY;  Surgeon: Clovis Pu. Cornett, MD;  Location: [facility] OR;  Service: General;  Laterality: N/A;    ROS:  PHYSICAL EXAM BP 128/70  Pulse 69  Ht 5\' 8"  (1.727 m)  Wt 166 lb 12.8 oz (75.66 kg)  BMI 25.37 kg/m2  SpO2 95%  EKG: Sinus bradycardia at 59 beats per minute. Otherwise, normal. First degree AV block.  ASSESSMENT AND PLAN  1. Coronary artery disease. No anginal symptoms  2. Sinus node dysfunction. No palpitations or syncope  3. Hypertension. Good control   Overall, doing well. We have decided to decrease aspirin to 81 mg 3 times per week. I will plan to see him in one year.

## 2013-07-02 NOTE — Patient Instructions (Addendum)
DECREASE ASPIRIN 81MG  TO 3 TIMES WEEKLY  Your physician recommends that you schedule a follow-up appointment in: 1 YEAR WITH DR.SMITH

## 2013-07-22 ENCOUNTER — Other Ambulatory Visit: Payer: Self-pay | Admitting: Internal Medicine

## 2013-07-22 DIAGNOSIS — R509 Fever, unspecified: Secondary | ICD-10-CM

## 2013-07-22 DIAGNOSIS — R6883 Chills (without fever): Secondary | ICD-10-CM

## 2013-07-23 ENCOUNTER — Ambulatory Visit
Admission: RE | Admit: 2013-07-23 | Discharge: 2013-07-23 | Disposition: A | Discharge status: Home / Self Care | Payer: Medicare Other | Source: Ambulatory Visit | Attending: Internal Medicine | Admitting: Internal Medicine

## 2013-07-23 DIAGNOSIS — R6883 Chills (without fever): Secondary | ICD-10-CM

## 2013-07-23 DIAGNOSIS — R509 Fever, unspecified: Secondary | ICD-10-CM

## 2013-07-24 ENCOUNTER — Other Ambulatory Visit: Payer: Self-pay | Admitting: Internal Medicine

## 2013-07-24 DIAGNOSIS — R509 Fever, unspecified: Secondary | ICD-10-CM

## 2013-07-24 DIAGNOSIS — R109 Unspecified abdominal pain: Secondary | ICD-10-CM

## 2013-07-25 ENCOUNTER — Other Ambulatory Visit (HOSPITAL_COMMUNITY): Payer: Self-pay | Admitting: Internal Medicine

## 2013-07-25 DIAGNOSIS — R509 Fever, unspecified: Secondary | ICD-10-CM

## 2013-07-25 DIAGNOSIS — R109 Unspecified abdominal pain: Secondary | ICD-10-CM

## 2013-07-26 ENCOUNTER — Ambulatory Visit (HOSPITAL_COMMUNITY): Payer: Medicare Other

## 2013-07-29 ENCOUNTER — Other Ambulatory Visit: Payer: Medicare Other

## 2013-08-01 ENCOUNTER — Other Ambulatory Visit: Payer: Self-pay | Admitting: Gastroenterology

## 2013-08-01 DIAGNOSIS — R109 Unspecified abdominal pain: Secondary | ICD-10-CM

## 2013-08-05 ENCOUNTER — Ambulatory Visit
Admission: RE | Admit: 2013-08-05 | Discharge: 2013-08-05 | Disposition: A | Discharge status: Home / Self Care | Payer: PRIVATE HEALTH INSURANCE | Source: Ambulatory Visit | Attending: Gastroenterology | Admitting: Gastroenterology

## 2013-08-05 DIAGNOSIS — R109 Unspecified abdominal pain: Secondary | ICD-10-CM

## 2013-08-05 MED ORDER — IOHEXOL 300 MG/ML  SOLN
100.0000 mL | Freq: Once | INTRAMUSCULAR | Status: AC | PRN
  Administered 2013-08-05: 100 mL via INTRAVENOUS

## 2013-08-06 ENCOUNTER — Encounter (HOSPITAL_COMMUNITY): Payer: Self-pay | Admitting: *Deleted

## 2013-08-09 ENCOUNTER — Encounter (HOSPITAL_COMMUNITY): Payer: Self-pay | Admitting: Pharmacy Technician

## 2013-08-13 ENCOUNTER — Other Ambulatory Visit: Payer: Self-pay | Admitting: Gastroenterology

## 2013-08-13 NOTE — Addendum Note (Signed)
Addended by: Willis Modena on: 08/13/2013 11:02 AM   Modules accepted: Orders

## 2013-08-14 ENCOUNTER — Ambulatory Visit (HOSPITAL_COMMUNITY): Payer: PRIVATE HEALTH INSURANCE | Admitting: Anesthesiology

## 2013-08-14 ENCOUNTER — Encounter (HOSPITAL_COMMUNITY)
Admission: RE | Disposition: A | Discharge status: Home / Self Care | Payer: Self-pay | Source: Ambulatory Visit | Attending: Gastroenterology

## 2013-08-14 ENCOUNTER — Encounter (HOSPITAL_COMMUNITY): Payer: PRIVATE HEALTH INSURANCE | Admitting: Anesthesiology

## 2013-08-14 ENCOUNTER — Ambulatory Visit (HOSPITAL_COMMUNITY): Payer: PRIVATE HEALTH INSURANCE

## 2013-08-14 ENCOUNTER — Encounter (HOSPITAL_COMMUNITY): Payer: Self-pay | Admitting: *Deleted

## 2013-08-14 ENCOUNTER — Ambulatory Visit (HOSPITAL_COMMUNITY)
Admission: RE | Admit: 2013-08-14 | Discharge: 2013-08-14 | Disposition: A | Discharge status: Home / Self Care | Payer: PRIVATE HEALTH INSURANCE | Source: Ambulatory Visit | Attending: Gastroenterology | Admitting: Gastroenterology

## 2013-08-14 DIAGNOSIS — Z87891 Personal history of nicotine dependence: Secondary | ICD-10-CM | POA: Insufficient documentation

## 2013-08-14 DIAGNOSIS — R7989 Other specified abnormal findings of blood chemistry: Secondary | ICD-10-CM | POA: Insufficient documentation

## 2013-08-14 DIAGNOSIS — I252 Old myocardial infarction: Secondary | ICD-10-CM | POA: Insufficient documentation

## 2013-08-14 DIAGNOSIS — Z951 Presence of aortocoronary bypass graft: Secondary | ICD-10-CM | POA: Insufficient documentation

## 2013-08-14 DIAGNOSIS — I10 Essential (primary) hypertension: Secondary | ICD-10-CM | POA: Insufficient documentation

## 2013-08-14 DIAGNOSIS — K8050 Calculus of bile duct without cholangitis or cholecystitis without obstruction: Secondary | ICD-10-CM | POA: Insufficient documentation

## 2013-08-14 HISTORY — PX: ERCP: SHX5425

## 2013-08-14 HISTORY — DX: Allergy, unspecified, initial encounter: T78.40XA

## 2013-08-14 HISTORY — PX: EUS: SHX5427

## 2013-08-14 LAB — HEPATIC FUNCTION PANEL
Albumin: 3.5 g/dL (ref 3.5–5.2)
Alkaline Phosphatase: 771 U/L — ABNORMAL HIGH (ref 39–117)
Bilirubin, Direct: 0.8 mg/dL — ABNORMAL HIGH (ref 0.0–0.3)
Indirect Bilirubin: 0.7 mg/dL (ref 0.3–0.9)
Total Bilirubin: 1.5 mg/dL — ABNORMAL HIGH (ref 0.3–1.2)
Total Protein: 7.6 g/dL (ref 6.0–8.3)

## 2013-08-14 SURGERY — ESOPHAGEAL ENDOSCOPIC ULTRASOUND (EUS) RADIAL
Anesthesia: Monitor Anesthesia Care

## 2013-08-14 MED ORDER — LIDOCAINE HCL (CARDIAC) 20 MG/ML IV SOLN
INTRAVENOUS | Status: AC
  Filled 2013-08-14: qty 5

## 2013-08-14 MED ORDER — SUCCINYLCHOLINE CHLORIDE 20 MG/ML IJ SOLN
INTRAMUSCULAR | Status: AC
  Filled 2013-08-14: qty 1

## 2013-08-14 MED ORDER — GLUCAGON HCL (RDNA) 1 MG IJ SOLR
INTRAMUSCULAR | Status: AC
  Filled 2013-08-14: qty 1

## 2013-08-14 MED ORDER — LACTATED RINGERS IV SOLN
INTRAVENOUS | Status: DC | PRN
  Administered 2013-08-14: 09:00:00 via INTRAVENOUS

## 2013-08-14 MED ORDER — BUTAMBEN-TETRACAINE-BENZOCAINE 2-2-14 % EX AERO
INHALATION_SPRAY | CUTANEOUS | Status: DC | PRN
  Administered 2013-08-14: 2 via TOPICAL

## 2013-08-14 MED ORDER — PROPOFOL 10 MG/ML IV BOLUS
INTRAVENOUS | Status: AC
  Filled 2013-08-14: qty 20

## 2013-08-14 MED ORDER — FENTANYL CITRATE 0.05 MG/ML IJ SOLN
INTRAMUSCULAR | Status: AC
  Filled 2013-08-14: qty 2

## 2013-08-14 MED ORDER — KETAMINE HCL 50 MG/ML IJ SOLN
INTRAMUSCULAR | Status: AC
  Filled 2013-08-14: qty 10

## 2013-08-14 MED ORDER — FENTANYL CITRATE 0.05 MG/ML IJ SOLN
INTRAMUSCULAR | Status: DC | PRN
  Administered 2013-08-14: 100 ug via INTRAVENOUS

## 2013-08-14 MED ORDER — PROPOFOL INFUSION 10 MG/ML OPTIME
INTRAVENOUS | Status: DC | PRN
  Administered 2013-08-14: 140 ug/kg/min via INTRAVENOUS

## 2013-08-14 MED ORDER — LIDOCAINE HCL (CARDIAC) 20 MG/ML IV SOLN
INTRAVENOUS | Status: DC | PRN
  Administered 2013-08-14: 50 mg via INTRAVENOUS

## 2013-08-14 MED ORDER — KETAMINE HCL 10 MG/ML IJ SOLN
INTRAMUSCULAR | Status: DC | PRN
  Administered 2013-08-14: 40 mg via INTRAVENOUS

## 2013-08-14 MED ORDER — SODIUM CHLORIDE 0.9 % IV SOLN
INTRAVENOUS | Status: DC | PRN
  Administered 2013-08-14: 11:00:00

## 2013-08-14 MED ORDER — CIPROFLOXACIN IN D5W 400 MG/200ML IV SOLN
400.0000 mg | Freq: Once | INTRAVENOUS | Status: AC
  Administered 2013-08-14: 400 mg via INTRAVENOUS

## 2013-08-14 MED ORDER — CIPROFLOXACIN IN D5W 400 MG/200ML IV SOLN
INTRAVENOUS | Status: AC
  Filled 2013-08-14: qty 200

## 2013-08-14 MED ORDER — BUTAMBEN-TETRACAINE-BENZOCAINE 2-2-14 % EX AERO
INHALATION_SPRAY | CUTANEOUS | Status: AC
  Filled 2013-08-14: qty 56

## 2013-08-14 MED ORDER — SODIUM CHLORIDE 0.9 % IV SOLN: INTRAVENOUS | Status: DC

## 2013-08-14 MED ORDER — GLUCAGON HCL (RDNA) 1 MG IJ SOLR
INTRAMUSCULAR | Status: DC | PRN
  Administered 2013-08-14: 1 mg via INTRAVENOUS
  Administered 2013-08-14 (×2): .5 mg via INTRAVENOUS

## 2013-08-14 NOTE — H&P (Signed)
Patient interval history reviewed.  Patient examined again.  There has been no change from documented H/P dated 08/01/13 (scanned into chart from our office) except as documented above.  Assessment:  1.  Dilated bile duct. 2.  Elevated LFTs.  Plan:  1.  Endoscopic ultrasound with possible fine needle aspiration. 2.  Risks (bleeding, infection, bowel perforation that could require surgery, sedation-related changes in cardiopulmonary systems), benefits (identification and possible treatment of source of symptoms, exclusion of certain causes of symptoms), and alternatives (watchful waiting, radiographic imaging studies, empiric medical treatment) of upper endoscopy with ultrasound and possible fine needle aspiration (EUS +/- FNA) were explained to patient/family in detail and patient wishes to proceed. 3.  Possible endoscopic retrograde cholangiopancreatography with possible biliary stenting. 4.  Risks (up to and including bleeding, infection, perforation, pancreatitis that can be complicated by infected necrosis and death), benefits (removal of stones, alleviating blockage, decreasing risk of cholangitis or choledocholithiasis-related pancreatitis), and alternatives (watchful waiting, percutaneous transhepatic cholangiography) of ERCP were explained to patient/family in detail and patient elects to proceed.

## 2013-08-14 NOTE — Preoperative (Signed)
Beta Blockers   Reason not to administer Beta Blockers:Not Applicable 

## 2013-08-14 NOTE — Anesthesia Preprocedure Evaluation (Addendum)
Anesthesia Evaluation  Patient identified by MRN, date of birth, ID band Patient awake    Reviewed: Allergy & Precautions, H&P , NPO status , Patient's Chart, lab work & pertinent test results  Airway Mallampati: II TM Distance: >3 FB Neck ROM: Full    Dental no notable dental hx.    Pulmonary neg pulmonary ROS, former smoker,  breath sounds clear to auscultation  Pulmonary exam normal       Cardiovascular hypertension, Pt. on medications + Past MI and + CABG Rhythm:Regular Rate:Normal + Systolic murmurs    Neuro/Psych negative neurological ROS  negative psych ROS   GI/Hepatic negative GI ROS, Neg liver ROS,   Endo/Other  negative endocrine ROS  Renal/GU negative Renal ROS  negative genitourinary   Musculoskeletal negative musculoskeletal ROS (+)   Abdominal   Peds negative pediatric ROS (+)  Hematology negative hematology ROS (+)   Anesthesia Other Findings   Reproductive/Obstetrics negative OB ROS                          Anesthesia Physical Anesthesia Plan  ASA: III  Anesthesia Plan: MAC   Post-op Pain Management:    Induction: Intravenous  Airway Management Planned: Oral ETT and Nasal Cannula  Additional Equipment:   Intra-op Plan:   Post-operative Plan: Extubation in OR  Informed Consent: I have reviewed the patients History and Physical, chart, labs and discussed the procedure including the risks, benefits and alternatives for the proposed anesthesia with the patient or authorized representative who has indicated his/her understanding and acceptance.   Dental advisory given  Plan Discussed with: CRNA and Surgeon  Anesthesia Plan Comments: (Will intubate if necessary for ERCP)        Anesthesia Quick Evaluation

## 2013-08-14 NOTE — Op Note (Signed)
[facility] [address] [place], [address]   ENDOSCOPIC ULTRASOUND PROCEDURE REPORT  PATIENT: Shane Berry, Shane Berry  MR#: [id number] BIRTHDATE: December 19, 1923  GENDER: Male ENDOSCOPIST: Willis Modena, MD REFERRED BY:  Kirby Funk, M.D. PROCEDURE DATE:  08/14/2013 PROCEDURE:   Upper EUS ASA CLASS:      Class III INDICATIONS:   1.  elevated LFTs, dilated bile duct. MEDICATIONS: MAC sedation, administered by CRNA, and Cetacaine spray x 2  DESCRIPTION OF PROCEDURE:   After the risks benefits and alternatives of the procedure were  explained, informed consent was obtained. The patient was then placed in the left, lateral, decubitus postion and IV sedation was administered. Throughout the procedure, the patients blood pressure, pulse and oxygen saturations were monitored continuously.  Under direct visualization, the     endoscope was introduced through the mouth and advanced to the second portion of the duodenum .  Water was used as necessary to provide an acoustic interface.  Upon completion of the imaging, water was removed and the patient was sent to the recovery room in satisfactory condition.      FINDINGS:      Pancreas head and uncinate process normal; no mass seen.  Ampulla normal.  Bile duct about 10mm in diameter.  Large 1 x 1cm stone in distal bile duct, just upstream of ampulla. Gallbladder with extensive microlithiasis and some sludge.  IMPRESSION:     Choledocholithiasis, likely cause of patient's elevated LFTs (which have intervally decreased) and bile duct dilatation.  RECOMMENDATIONS:     1.  Watch for potential complications of procedure. 2.  ERCP today.   _______________________________ Rosalie DoctorWillis Modena, MD 08/14/2013 11:25 AM   CC:

## 2013-08-14 NOTE — Op Note (Signed)
[facility] [address] [place], [address]   ERCP PROCEDURE REPORT  PATIENT: Shane Berry, Shane Berry  MR# :[id number] BIRTHDATE: 12-Apr-1924  GENDER: Male ENDOSCOPIST: Willis Modena, MD REFERRED BY: Kirby Funk, M.D. PROCEDURE DATE:  08/14/2013 PROCEDURE:    ERCP with biliary sphincterotomy, bile duct stone extraction, and biliary stent placement. ASA CLASS:    ASA-III INDICATIONS: Elevated LFTs, dilated bile duct, CBD stone (seen on EUS) MEDICATIONS:    MAC anesthesia, administered by CRNA; Ciprofloxacin 400 mg IV TOPICAL ANESTHETIC:  Cetacaine spray.  DESCRIPTION OF PROCEDURE:   After the risks benefits and alternatives of the procedure were thoroughly explained, informed consent was obtained.  The side-viewing duodenoendoscope was introduced through the mouth and advanced to the second portion of the duodenum .     FINDINGS:  Ampulla very edematous with large orifice, suggestive of prior stone passage (or at least attempted prior stone passage). Extrahepatic bile duct about 10mm in diameter with normal-appearing intrahepatic biliary tree.  Large distal bile duct stone seen. Generous biliary sphincterotomy performed and most of the stone was removed with trapezoidal extraction basket.  However, in light of edema (seen previously and worse after biliary sphincterotomy), I was unable to remove a couple residual tiny fragments.  Because of this, and because of scant biliary drainage post procedure, a 10Fr 5cm biliary plastic stent was placed to conclude the procedure. There was good drainage post procedure through and around the stent.  Pancreatogram was not obtained, intentionally.  There were no immediate complications.  ENDOSCOPIC IMPRESSION:  As above.  RECOMMENDATIONS: 1.  Watch for potential complications of the procedure. 2.  No ASA/NSAIDs for 5 days post-procedure. 3.  Repeat ERCP in 3-4 weeks, for stent removal and removal of any residual stone  fragments. 4.  Follow-up with [facility] in 2 weeks.     _______________________________ Rosalie DoctorWillis Modena, MD 08/14/2013 11:33 AM   CC:

## 2013-08-14 NOTE — Transfer of Care (Signed)
Immediate Anesthesia Transfer of Care Note  Patient: Shane Berry  Procedure(s) Performed: Procedure(s): ESOPHAGEAL ENDOSCOPIC ULTRASOUND (EUS) RADIAL (N/A) ENDOSCOPIC RETROGRADE CHOLANGIOPANCREATOGRAPHY (ERCP) (N/A)  Patient Location: PACU  Anesthesia Type:MAC  Level of Consciousness: awake, alert  and oriented  Airway & Oxygen Therapy: Patient Spontanous Breathing and Patient connected to nasal cannula oxygen  Post-op Assessment: Report given to PACU RN and Post -op Vital signs reviewed and stable  Post vital signs: Reviewed and stable  Complications: No apparent anesthesia complications

## 2013-08-14 NOTE — Anesthesia Postprocedure Evaluation (Signed)
  Anesthesia Post-op Note  Patient: Shane Berry  Procedure(s) Performed: Procedure(s) (LRB): ESOPHAGEAL ENDOSCOPIC ULTRASOUND (EUS) RADIAL (N/A) ENDOSCOPIC RETROGRADE CHOLANGIOPANCREATOGRAPHY (ERCP) (N/A)  Patient Location: PACU  Anesthesia Type: MAC  Level of Consciousness: awake and alert   Airway and Oxygen Therapy: Patient Spontanous Breathing  Post-op Pain: mild  Post-op Assessment: Post-op Vital signs reviewed, Patient's Cardiovascular Status Stable, Respiratory Function Stable, Patent Airway and No signs of Nausea or vomiting  Last Vitals:  Filed Vitals:   08/14/13 1127  BP: 126/73  Temp: 36.4 C  Resp: 13    Post-op Vital Signs: stable   Complications: No apparent anesthesia complications

## 2013-08-15 ENCOUNTER — Encounter (HOSPITAL_COMMUNITY): Payer: Self-pay | Admitting: Gastroenterology

## 2013-08-27 ENCOUNTER — Encounter (HOSPITAL_COMMUNITY): Payer: Self-pay | Admitting: Pharmacy Technician

## 2013-09-03 ENCOUNTER — Other Ambulatory Visit: Payer: Self-pay | Admitting: Gastroenterology

## 2013-09-04 ENCOUNTER — Ambulatory Visit (HOSPITAL_COMMUNITY)
Admission: RE | Admit: 2013-09-04 | Discharge: 2013-09-04 | Disposition: A | Discharge status: Home / Self Care | Payer: PRIVATE HEALTH INSURANCE | Source: Ambulatory Visit | Attending: Gastroenterology | Admitting: Gastroenterology

## 2013-09-04 ENCOUNTER — Encounter (HOSPITAL_COMMUNITY)
Admission: RE | Disposition: A | Discharge status: Home / Self Care | Payer: Self-pay | Source: Ambulatory Visit | Attending: Gastroenterology

## 2013-09-04 ENCOUNTER — Encounter (HOSPITAL_COMMUNITY): Payer: PRIVATE HEALTH INSURANCE | Admitting: Anesthesiology

## 2013-09-04 ENCOUNTER — Ambulatory Visit (HOSPITAL_COMMUNITY): Payer: PRIVATE HEALTH INSURANCE | Admitting: Anesthesiology

## 2013-09-04 ENCOUNTER — Encounter (HOSPITAL_COMMUNITY): Payer: Self-pay | Admitting: *Deleted

## 2013-09-04 ENCOUNTER — Ambulatory Visit (HOSPITAL_COMMUNITY): Payer: PRIVATE HEALTH INSURANCE

## 2013-09-04 DIAGNOSIS — E785 Hyperlipidemia, unspecified: Secondary | ICD-10-CM | POA: Insufficient documentation

## 2013-09-04 DIAGNOSIS — K8020 Calculus of gallbladder without cholecystitis without obstruction: Secondary | ICD-10-CM | POA: Insufficient documentation

## 2013-09-04 DIAGNOSIS — I252 Old myocardial infarction: Secondary | ICD-10-CM | POA: Insufficient documentation

## 2013-09-04 DIAGNOSIS — Z79899 Other long term (current) drug therapy: Secondary | ICD-10-CM | POA: Insufficient documentation

## 2013-09-04 DIAGNOSIS — I2510 Atherosclerotic heart disease of native coronary artery without angina pectoris: Secondary | ICD-10-CM | POA: Insufficient documentation

## 2013-09-04 DIAGNOSIS — Z951 Presence of aortocoronary bypass graft: Secondary | ICD-10-CM | POA: Insufficient documentation

## 2013-09-04 HISTORY — PX: GASTROINTESTINAL STENT REMOVAL: SHX6384

## 2013-09-04 HISTORY — PX: ERCP: SHX5425

## 2013-09-04 SURGERY — ERCP, WITH INTERVENTION IF INDICATED
Anesthesia: General

## 2013-09-04 MED ORDER — GLUCAGON HCL (RDNA) 1 MG IJ SOLR
INTRAMUSCULAR | Status: DC | PRN
  Administered 2013-09-04: .5 mg via INTRAVENOUS

## 2013-09-04 MED ORDER — LACTATED RINGERS IV SOLN
INTRAVENOUS | Status: DC | PRN
  Administered 2013-09-04: 10:00:00 via INTRAVENOUS

## 2013-09-04 MED ORDER — LIDOCAINE HCL (CARDIAC) 20 MG/ML IV SOLN
INTRAVENOUS | Status: DC | PRN
  Administered 2013-09-04: 80 mg via INTRAVENOUS

## 2013-09-04 MED ORDER — PROPOFOL 10 MG/ML IV BOLUS
INTRAVENOUS | Status: DC | PRN
  Administered 2013-09-04: 120 mg via INTRAVENOUS

## 2013-09-04 MED ORDER — ROCURONIUM BROMIDE 100 MG/10ML IV SOLN
INTRAVENOUS | Status: DC | PRN
  Administered 2013-09-04: 20 mg via INTRAVENOUS

## 2013-09-04 MED ORDER — SUCCINYLCHOLINE CHLORIDE 20 MG/ML IJ SOLN
INTRAMUSCULAR | Status: AC
  Filled 2013-09-04: qty 1

## 2013-09-04 MED ORDER — SUCCINYLCHOLINE CHLORIDE 20 MG/ML IJ SOLN
INTRAMUSCULAR | Status: DC | PRN
  Administered 2013-09-04: 100 mg via INTRAVENOUS

## 2013-09-04 MED ORDER — NEOSTIGMINE METHYLSULFATE 1 MG/ML IJ SOLN
INTRAMUSCULAR | Status: DC | PRN
  Administered 2013-09-04: 2 mg via INTRAVENOUS

## 2013-09-04 MED ORDER — GLYCOPYRROLATE 0.2 MG/ML IJ SOLN
INTRAMUSCULAR | Status: DC | PRN
  Administered 2013-09-04: .2 mg via INTRAVENOUS

## 2013-09-04 MED ORDER — SODIUM CHLORIDE 0.9 % IV SOLN
INTRAVENOUS | Status: DC
Start: 2013-09-04 — End: 2013-09-04

## 2013-09-04 MED ORDER — ROCURONIUM BROMIDE 100 MG/10ML IV SOLN
INTRAVENOUS | Status: AC
  Filled 2013-09-04: qty 1

## 2013-09-04 MED ORDER — LIDOCAINE HCL (CARDIAC) 20 MG/ML IV SOLN
INTRAVENOUS | Status: AC
  Filled 2013-09-04: qty 5

## 2013-09-04 MED ORDER — ASPIRIN 81 MG PO TABS: 81.0000 mg | ORAL_TABLET | ORAL | Status: AC

## 2013-09-04 MED ORDER — CIPROFLOXACIN IN D5W 400 MG/200ML IV SOLN
400.0000 mg | Freq: Two times a day (BID) | INTRAVENOUS | Status: DC
  Administered 2013-09-04 (×2): 400 mg via INTRAVENOUS

## 2013-09-04 MED ORDER — CIPROFLOXACIN IN D5W 400 MG/200ML IV SOLN
INTRAVENOUS | Status: AC
  Filled 2013-09-04: qty 200

## 2013-09-04 MED ORDER — PROPOFOL 10 MG/ML IV BOLUS
INTRAVENOUS | Status: AC
  Filled 2013-09-04: qty 20

## 2013-09-04 MED ORDER — SODIUM CHLORIDE 0.9 % IV SOLN
INTRAVENOUS | Status: DC | PRN
  Administered 2013-09-04: 12:00:00

## 2013-09-04 MED ORDER — GLYCOPYRROLATE 0.2 MG/ML IJ SOLN
INTRAMUSCULAR | Status: AC
  Filled 2013-09-04: qty 1

## 2013-09-04 NOTE — H&P (Signed)
Patient interval history reviewed.  Patient examined again.  There has been no change from documented H/P dated 09/02/13 (scanned into chart from our office) except as documented above.  Assessment:  1.  Bile duct stone.  Plan:  1.  Endoscopic retrograde cholangiopancreatography with biliary stent removal, possible extension of previous biliary sphincterotomy, and removal of any residual stone fragments. 2.  Risks (up to and including bleeding, infection, perforation, pancreatitis that can be complicated by infected necrosis and death), benefits (removal of stones, alleviating blockage, decreasing risk of cholangitis or choledocholithiasis-related pancreatitis), and alternatives (watchful waiting, percutaneous transhepatic cholangiography) of ERCP were explained to patient/family in detail and patient elects to proceed.

## 2013-09-04 NOTE — Op Note (Signed)
[facility] [address] [place], [address]   ERCP PROCEDURE REPORT  PATIENT: Shane Berry, Shane Berry  MR# :[id number] BIRTHDATE: [birth year]  GENDER: Male ENDOSCOPIST: Willis Modena, MD REFERRED BY: Kirby Funk, M.D. PROCEDURE DATE:  09/04/2013 PROCEDURE:   ERCP with sphincterotomy/papillotomy, ERCP with removal of calculus/calculi , and ERCP with foreign body removal ASA CLASS:  ASA-III INDICATIONS: Bile duct stone MEDICATIONS:    General endotracheal anesthesia (GETA), Cipro 400 mg IV  DESCRIPTION OF PROCEDURE:   After the risks benefits and alternatives of the procedure were thoroughly explained, informed consent was obtained.  The side-viewing duodenoscope  was introduced through the mouth and advanced to the second portion of the duodenum .      FINDINGS:  Previously placed bile duct stent was seen and removed with snare.  Biliary cannulation achieved; non-dilated bile duct with couple possible distal filling defects seen; biliary sphincterotomy was extended with blended current without immediate complications.  Balloon and basket trawled the duct several times; couple bits of stony debris were extracted.  Post-completion cholangiogram showed no obvious residual filling defects.  Biliary drainage per ampulla was brisk pre- and post-procedure. Pancreatogram was not obtained, intentionally.  ENDOSCOPIC IMPRESSION:  As above.  Successful clearance of distal bile duct.  RECOMMENDATIONS: 1.  Watch for potential complications of procedure. 2.  No ASA/NSAIDs for 5 days post-papillotomy. 3.  Follow-up with [facility] on as-needed basis; no anticipated need for further GI intervention at this time.     _______________________________ Rosalie DoctorWillis Modena, MD 09/04/2013 11:53 AM   CC:

## 2013-09-04 NOTE — Addendum Note (Signed)
Addended by: Willis Modena on: 09/04/2013 09:21 AM   Modules accepted: Orders

## 2013-09-04 NOTE — Transfer of Care (Signed)
Immediate Anesthesia Transfer of Care Note  Patient: Shane Berry  Procedure(s) Performed: Procedure(s): ENDOSCOPIC RETROGRADE CHOLANGIOPANCREATOGRAPHY (ERCP) (N/A) GASTROINTESTINAL STENT REMOVAL (N/A)  Patient Location: PACU  Anesthesia Type:General  Level of Consciousness: awake, alert  and oriented  Airway & Oxygen Therapy: Patient Spontanous Breathing and Patient connected to face mask oxygen  Post-op Assessment: Report given to PACU RN and Post -op Vital signs reviewed and stable  Post vital signs: Reviewed and stable  Complications: No apparent anesthesia complications

## 2013-09-04 NOTE — Anesthesia Postprocedure Evaluation (Signed)
Anesthesia Post Note  Patient: Shane Berry  Procedure(s) Performed: Procedure(s) (LRB): ENDOSCOPIC RETROGRADE CHOLANGIOPANCREATOGRAPHY (ERCP) (N/A) GASTROINTESTINAL STENT REMOVAL (N/A)  Anesthesia type: General  Patient location: PACU  Post pain: Pain level controlled  Post assessment: Post-op Vital signs reviewed  Last Vitals:  Filed Vitals:   09/04/13 0924  BP: 142/70  Pulse: 67  Temp: 36.5 C  Resp: 18    Post vital signs: Reviewed  Level of consciousness: sedated  Complications: No apparent anesthesia complications

## 2013-09-04 NOTE — Anesthesia Preprocedure Evaluation (Addendum)
Anesthesia Evaluation  Patient identified by MRN, date of birth, ID band Patient awake    Reviewed: Allergy & Precautions, H&P , NPO status , Patient's Chart, lab work & pertinent test results  History of Anesthesia Complications (+) history of anesthetic complications (Patient describes nightmares following MAC procedure in past)  Airway Mallampati: II TM Distance: >3 FB Neck ROM: Full    Dental no notable dental hx. (+) Teeth Intact, Caps and Dental Advisory Given   Pulmonary neg pulmonary ROS, former smoker,  breath sounds clear to auscultation  Pulmonary exam normal       Cardiovascular hypertension, Pt. on medications + CAD, + Past MI and + CABG + dysrhythmias Atrial Fibrillation Rhythm:Regular Rate:Normal + Systolic murmurs    Neuro/Psych negative neurological ROS  negative psych ROS   GI/Hepatic negative GI ROS, Neg liver ROS, PUD,   Endo/Other  negative endocrine ROS  Renal/GU negative Renal ROS  negative genitourinary   Musculoskeletal negative musculoskeletal ROS (+)   Abdominal   Peds  Hematology negative hematology ROS (+)   Anesthesia Other Findings   Reproductive/Obstetrics                          Anesthesia Physical Anesthesia Plan  ASA: III  Anesthesia Plan: General   Post-op Pain Management:    Induction: Intravenous  Airway Management Planned: Oral ETT  Additional Equipment:   Intra-op Plan:   Post-operative Plan:   Informed Consent: I have reviewed the patients History and Physical, chart, labs and discussed the procedure including the risks, benefits and alternatives for the proposed anesthesia with the patient or authorized representative who has indicated his/her understanding and acceptance.   Dental advisory given  Plan Discussed with: CRNA  Anesthesia Plan Comments: (Will proceed with general anesthesia rather than MAC. Avoid Ketamine intraop.)         Anesthesia Quick Evaluation

## 2013-09-05 ENCOUNTER — Encounter (HOSPITAL_COMMUNITY): Payer: Self-pay | Admitting: Gastroenterology

## 2014-05-10 IMAGING — CT CT ABD-PELV W/ CM
3 of 5 series · 13 of 36 positions shown, 19 images · IV contrast (READICAT/WATER & [ID] OMNI 300)
Comparison: CT 07/20/2012.

CLINICAL DATA: Abdominal pain with elevated LFT

EXAM:
CT ABDOMEN AND PELVIS WITH CONTRAST
TECHNIQUE: Multidetector CT imaging of the abdomen and pelvis was performed
using the standard protocol following bolus administration of
intravenous contrast.
CONTRAST:  100mL OMNIPAQUE IOHEXOL 300 MG/ML  SOLN

[Series 3: abd/pelvis with · axial · 0.81mm/px · z∈[-398,-58]mm · 7 of 92 slices shown, 12 images]
[im 12/92  soft-tissue]
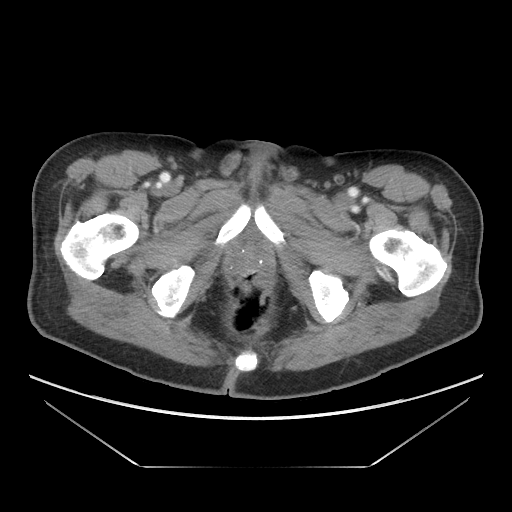
[im 12/92  bone]
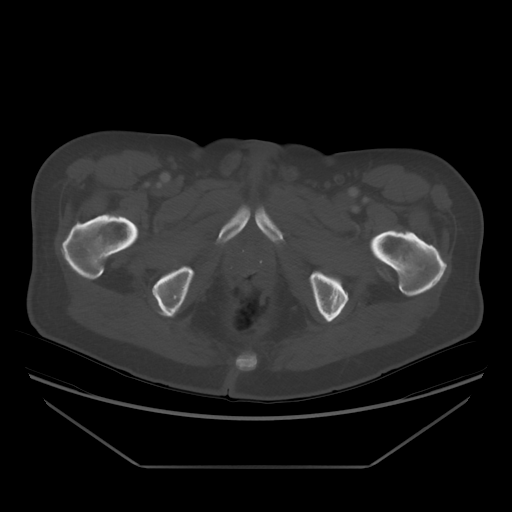
[im 23/92  soft-tissue]
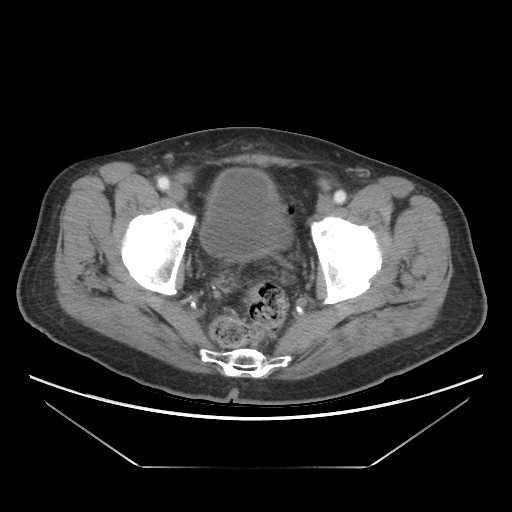
[im 35/92  soft-tissue]
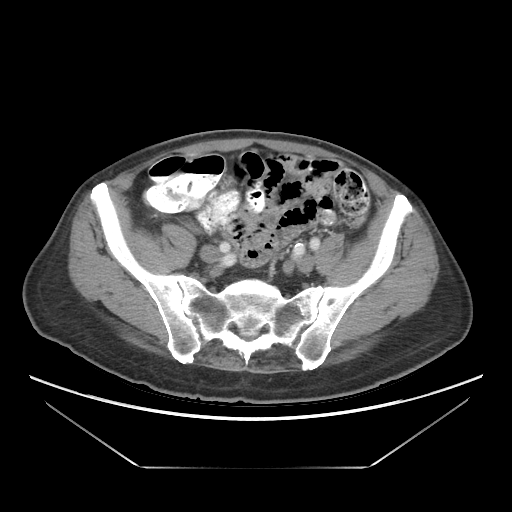
[im 46/92  soft-tissue]
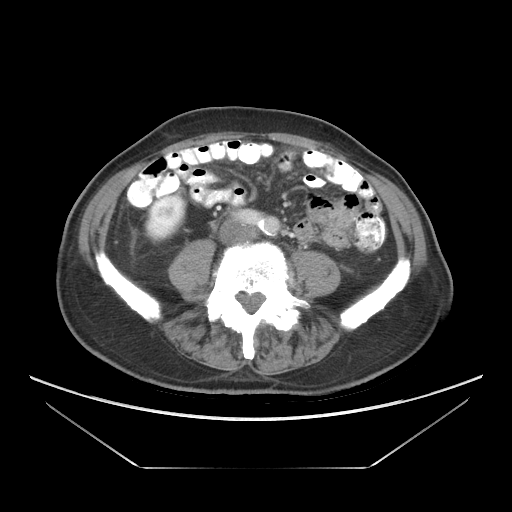
[im 46/92  lung]
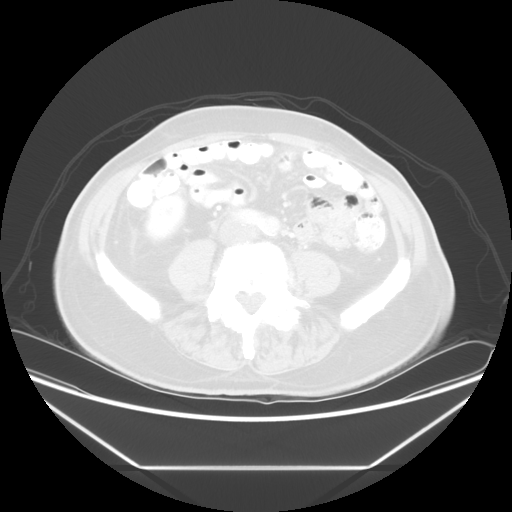
[im 57/92  soft-tissue]
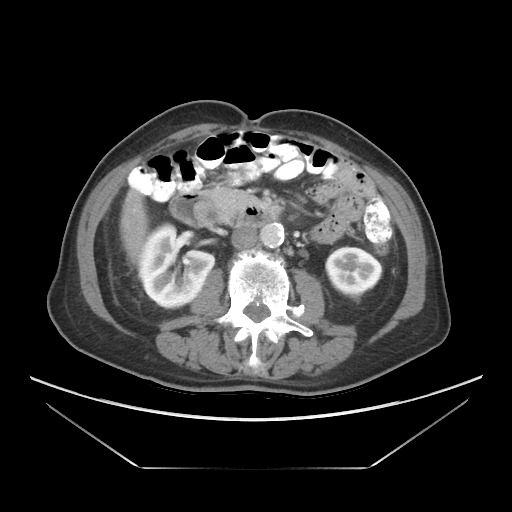
[im 57/92  lung]
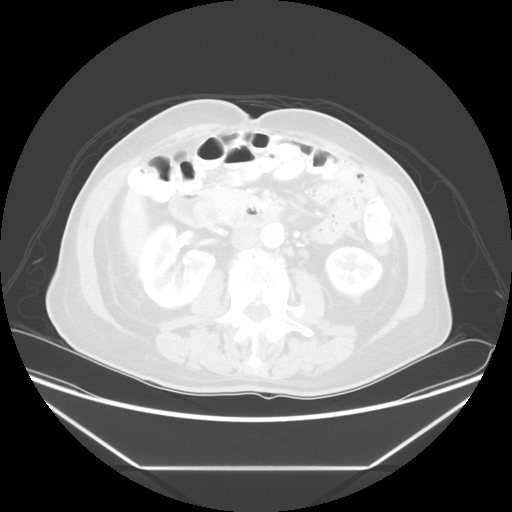
[im 69/92  soft-tissue]
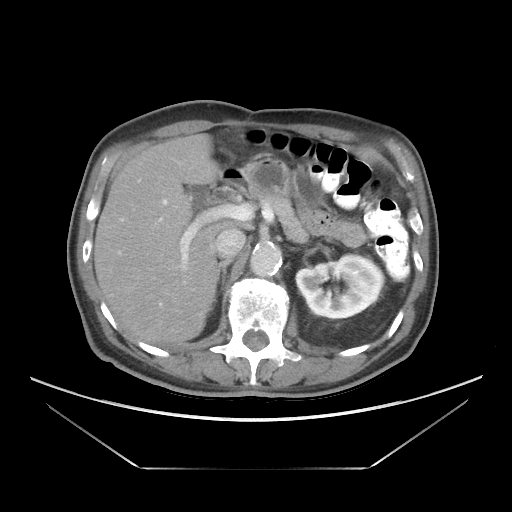
[im 69/92  lung]
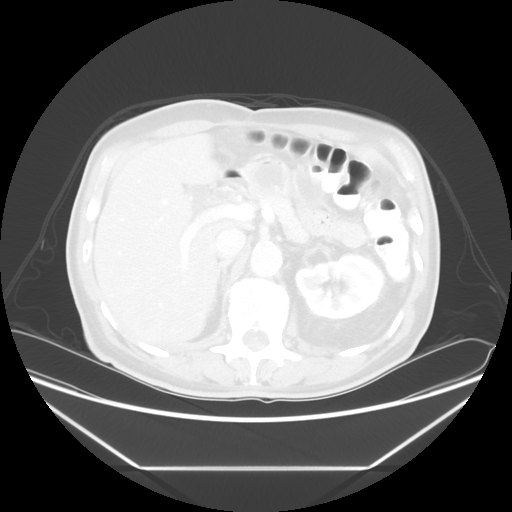
[im 80/92  soft-tissue]
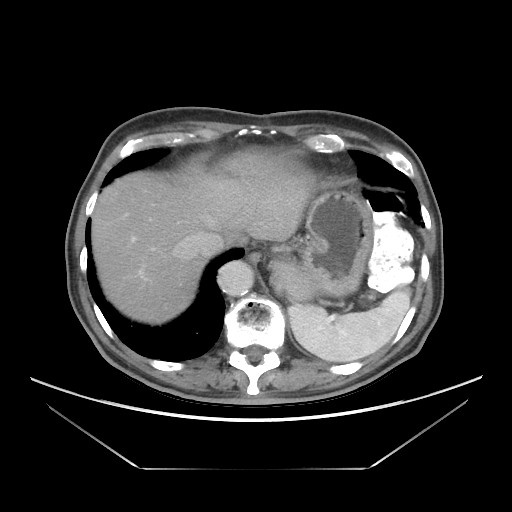
[im 80/92  lung]
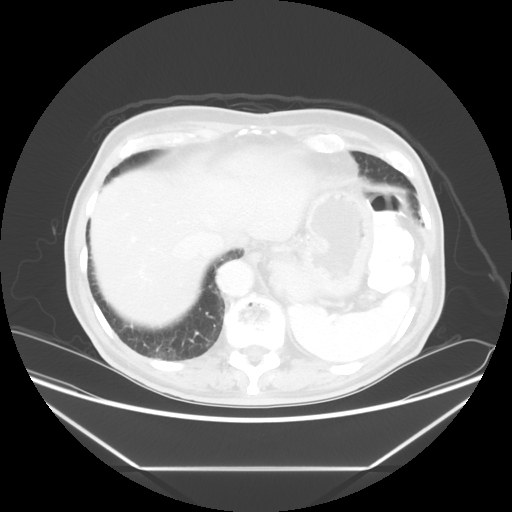

[Series 601: coronal body · coronal · 0.90mm/px · 1 of 103 slices shown, 2 images]
[im 35/103  soft-tissue]
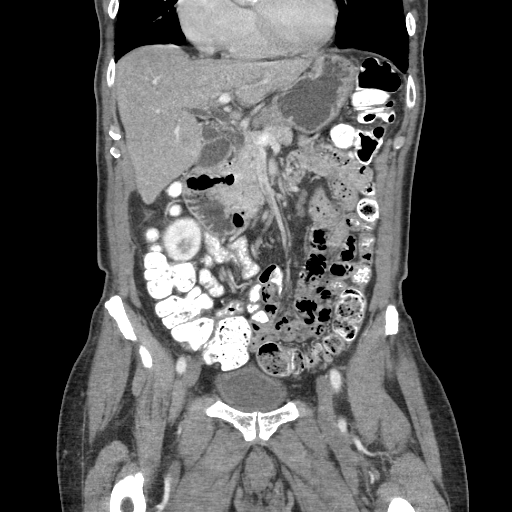
[im 35/103  bone]
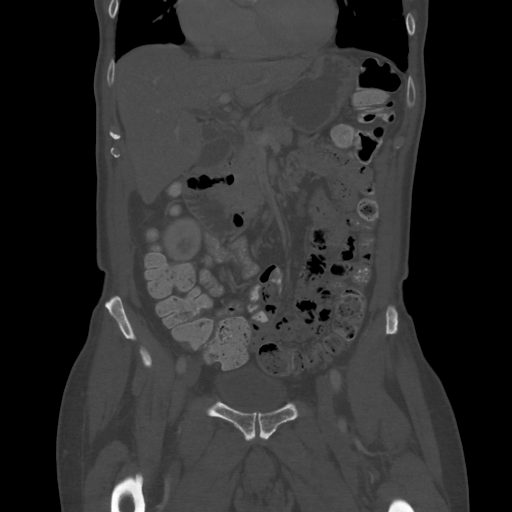

[Series 602: sagittal body · sagittal · 0.90mm/px · 5 of 155 slices shown]
[im 11/155  soft-tissue]
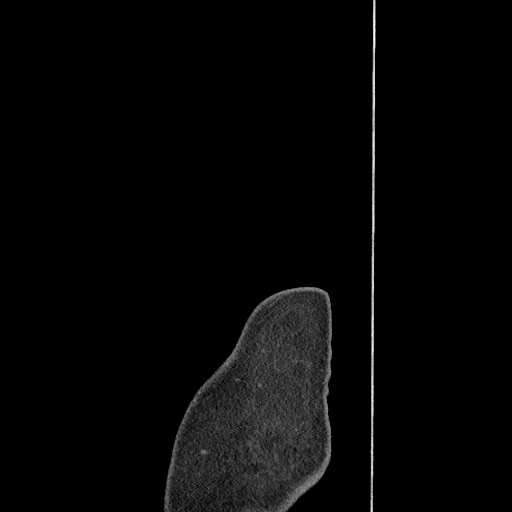
[im 31/155  soft-tissue]
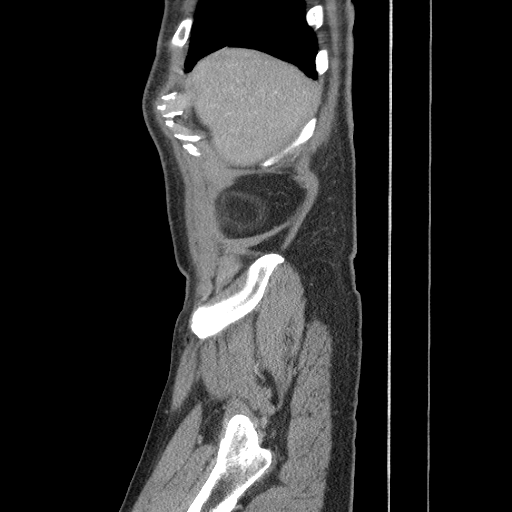
[im 52/155  soft-tissue]
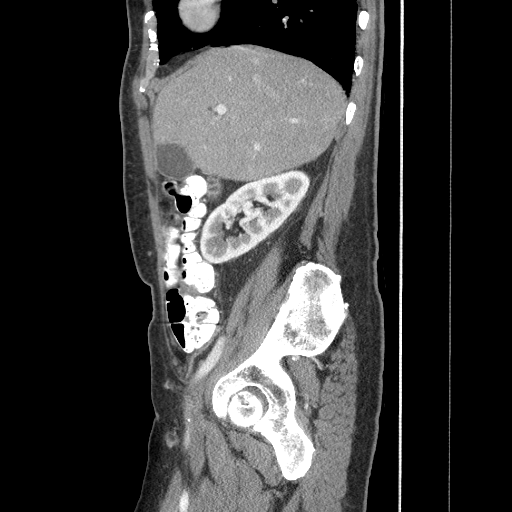
[im 72/155  soft-tissue]
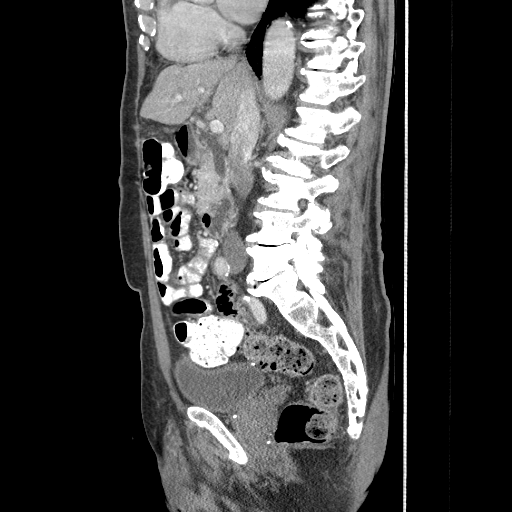
[im 83/155  soft-tissue]
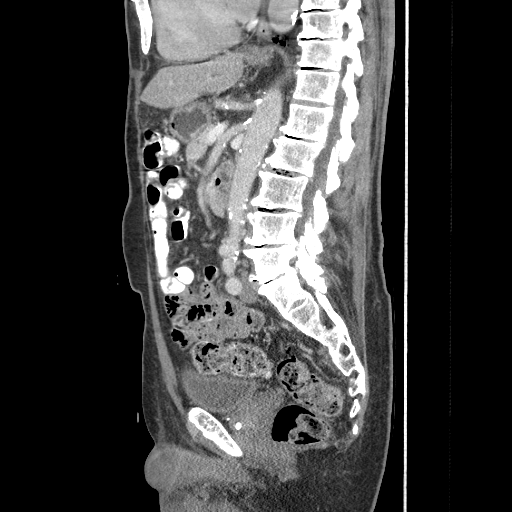

[13 of 36 positions shown; findings below may reference images not displayed]

FINDINGS: The lung bases are clear. Heart size is upper normal. Biliary ductal
dilatation is present. Intrahepatic bile ducts are dilated. Common
bile duct measures approximately 10.9 mm. No obstructing mass lesion
is present. Gallbladder is in place and not thickened. No calcified
gallstones. Liver and spleen are normal. Pancreas is normal without
edema or mass. Atherosclerotic aorta without aneurysm. Negative for
bowel obstruction. No bowel thickening. Mild sigmoid diverticulosis.
Prostate enlargement. Lumbar spondylosis without fracture.
IMPRESSION: Biliary dilatation. This may be due to a common duct stone,
stricture, or tumor. No mass lesion is identified.

## 2014-07-04 ENCOUNTER — Ambulatory Visit (INDEPENDENT_AMBULATORY_CARE_PROVIDER_SITE_OTHER): Payer: Medicare Other | Admitting: Interventional Cardiology

## 2014-07-04 ENCOUNTER — Encounter: Payer: Self-pay | Admitting: Interventional Cardiology

## 2014-07-04 VITALS — BP 124/68 | HR 56 | Ht 68.0 in | Wt 166.0 lb

## 2014-07-04 DIAGNOSIS — E785 Hyperlipidemia, unspecified: Secondary | ICD-10-CM

## 2014-07-04 DIAGNOSIS — I495 Sick sinus syndrome: Secondary | ICD-10-CM

## 2014-07-04 DIAGNOSIS — I25708 Atherosclerosis of coronary artery bypass graft(s), unspecified, with other forms of angina pectoris: Secondary | ICD-10-CM

## 2014-07-04 DIAGNOSIS — I25798 Atherosclerosis of other coronary artery bypass graft(s) with other forms of angina pectoris: Secondary | ICD-10-CM

## 2014-07-04 NOTE — Progress Notes (Signed)
Patient ID: Shane Berry, male   DOB: 22-Nov-1923, 80 y.o.   MRN: [id number]    [address], [address] [place], [place]  [address] Phone: [phone] Fax:  [phone]  Date:  07/04/2014   ID:  Shane Berry, DOB 1924/04/29, MRN [id number]  PCP:  Irven Shelling, MD   ASSESSMENT:   1. Coronary atherosclerotic heart disease with prior coronary bypass surgery, stable 2. Hypertension, stable 3. Systolic murmur consistent with mild aortic stenosis  PLAN:  1. No change in the current medical regimen   SUBJECTIVE: Shane Berry is a 80 y.o. male who is doing well. I had syncope. Is no CV complaints.   Wt Readings from Last 3 Encounters:  07/04/14 166 lb (75.297 kg)  09/04/13 160 lb (72.576 kg)  09/04/13 160 lb (72.576 kg)     Past Medical History  Diagnosis Date  . Heart murmur   . Hyperlipidemia   . Heart attack   . Duodenal ulcer perforation 07/20/2012  . HTN (hypertension)     08-06-13 Pt. denies any problems  . Allergy     seasonal    Current Outpatient Prescriptions  Medication Sig Dispense Refill  . aspirin 81 MG tablet Take 1 tablet (81 mg total) by mouth 3 (three) times a week.  30 tablet    . atorvastatin (LIPITOR) 10 MG tablet Take 10 mg by mouth daily with breakfast.       . ketoconazole (NIZORAL) 2 % shampoo Apply 1 application topically as needed.       . Multiple Vitamin (MULTIVITAMIN WITH MINERALS) TABS Take 1 tablet by mouth daily.      Marland Kitchen omeprazole (PRILOSEC) 40 MG capsule Take 40 mg by mouth daily.       No current facility-administered medications for this visit.    Allergies:    Allergies  Allergen Reactions  . Nsaids Other (See Comments)    GI perforation    Social History:  The patient  reports that he quit smoking about 48 years ago. His smoking use included Cigarettes. He has a 15 pack-year smoking history. He has never used smokeless tobacco. He reports that he drinks about .6 ounces of alcohol per week. He reports that he does  not use illicit drugs.   ROS:  Please see the history of present illness.   Appetite is been stable. No orthopnea PND.   All other systems reviewed and negative.   OBJECTIVE: VS:  BP 124/68  Pulse 56  Ht 5\' 8"  (1.727 m)  Wt 166 lb (75.297 kg)  BMI 25.25 kg/m2 Well nourished, well developed, in no acute distress, elderly and frail HEENT: normal Neck: JVD flat. Carotid bruit absent  Cardiac:  normal S1, S2; RRR; 2 of 6 retrosternal border systolic murmur consistent with aortic valve/outflow. Lungs:  clear to auscultation bilaterally, no wheezing, rhonchi or rales Abd: soft, nontender, no hepatomegaly Ext: Edema none. Pulses 2+ Skin: warm and dry Neuro:  CNs 2-12 intact, no focal abnormalities noted  EKG:  Sinus arrhythmia without acute change. Bradycardia.       Signed, Illene Labrador III, MD 07/04/2014 8:40 AM

## 2014-07-04 NOTE — Patient Instructions (Signed)
Your physician recommends that you continue on your current medications as directed. Please refer to the Current Medication list given to you today.  Your physician wants you to follow-up in: 1 YEAR OV/EKG  You will receive a reminder letter in the mail two months in advance. If you don't receive a letter, please call our office to schedule the follow-up appointment.  

## 2015-01-23 ENCOUNTER — Telehealth: Payer: Self-pay | Admitting: *Deleted

## 2015-01-23 ENCOUNTER — Other Ambulatory Visit: Payer: Self-pay | Admitting: *Deleted

## 2015-01-23 MED ORDER — NITROGLYCERIN 0.4 MG SL SUBL: 0.4000 mg | SUBLINGUAL_TABLET | SUBLINGUAL | Status: DC | PRN

## 2015-01-23 NOTE — Telephone Encounter (Signed)
Patient requests refill on nitro. Not on med list. Ok to refill? Please advise. Thanks, MI

## 2016-01-27 ENCOUNTER — Other Ambulatory Visit: Payer: Self-pay | Admitting: Interventional Cardiology

## 2016-03-05 ENCOUNTER — Other Ambulatory Visit: Payer: Self-pay | Admitting: Interventional Cardiology

## 2016-08-02 ENCOUNTER — Other Ambulatory Visit: Payer: Self-pay | Admitting: Interventional Cardiology

## 2016-08-02 NOTE — Telephone Encounter (Signed)
Please advise on refill request as the patient has not been seen in over two years.

## 2016-08-02 NOTE — Telephone Encounter (Signed)
Spoke with pt and he states he was just requesting refills because what he had at home was old and had turned to powder.  Advised pt he would need an appt for further refills.  Scheduled pt for January to see Dr. Tamala Julian.  Pt verbalized understanding and was appreciative for call.

## 2016-10-25 DIAGNOSIS — I10 Essential (primary) hypertension: Secondary | ICD-10-CM | POA: Insufficient documentation

## 2016-10-25 NOTE — Progress Notes (Signed)
Cardiology Office Note    Date:  10/26/2016   ID:  Shane Berry, DOB 10-07-1923, MRN [id number]  PCP:  Irven Shelling, MD  Cardiologist: Sinclair Grooms, MD   Chief Complaint  Patient presents with  . Coronary Artery Disease  . Cardiac Valve Problem    History of Present Illness:  Shane Berry is a 83 y.o. male with history of coronary artery disease and bypass surgery performed in 2007 by Dr. Servando Snare, aortic valve disease, and hypertension. Stable angina over the last 5 years.  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 He underwent bypass surgery in 2007. Report is not available. Over the past 5 years he has had exertional jaw and throat discomfort with walking. As he is aged his activity has diminished, not exercising as frequently as he used to. He has no episodes of discomfort at rest. He denies orthopnea, PND, and swelling. He has never had syncope. No prolonged palpitations.  Past Medical History:  Diagnosis Date  . Allergy    seasonal  . Duodenal ulcer perforation ([facility]) 07/20/2012  . Heart attack   . Heart murmur   . HTN (hypertension)    08-06-13 Pt. denies any problems  . Hyperlipidemia     Past Surgical History:  Procedure Laterality Date  . CORONARY ARTERY BYPASS GRAFT      x2 vessels-greater than 10 yrs  . ERCP N/A 08/14/2013   Procedure: ENDOSCOPIC RETROGRADE CHOLANGIOPANCREATOGRAPHY (ERCP);  Surgeon: Arta Silence, MD;  Location: [facility];  Service: Endoscopy;  Laterality: N/A;  . ERCP N/A 09/04/2013   Procedure: ENDOSCOPIC RETROGRADE CHOLANGIOPANCREATOGRAPHY (ERCP);  Surgeon: Arta Silence, MD;  Location: [facility];  Service: Endoscopy;  Laterality: N/A;  . EUS N/A 08/14/2013   Procedure: ESOPHAGEAL ENDOSCOPIC ULTRASOUND (EUS) RADIAL;  Surgeon: Arta Silence, MD;  Location: [facility];  Service: Endoscopy;  Laterality: N/A;  . GASTROINTESTINAL STENT REMOVAL N/A 09/04/2013   Procedure: GASTROINTESTINAL STENT REMOVAL;  Surgeon: Arta Silence, MD;  Location: [facility];  Service: Endoscopy;  Laterality: N/A;  . LAPAROTOMY  07/21/2012   Procedure: EXPLORATORY LAPAROTOMY;  Surgeon: Joyice Faster. Cornett, MD;  Location: [facility];  Service: General;  Laterality: N/A;repair "perforated colon"    Current Medications: Outpatient Medications Prior to Visit  Medication Sig Dispense Refill  . aspirin 81 MG tablet Take 1 tablet (81 mg total) by mouth 3 (three) times a week. 30 tablet   . atorvastatin (LIPITOR) 10 MG tablet Take 10 mg by mouth daily with breakfast.     . ketoconazole (NIZORAL) 2 % shampoo Apply 1 application topically as needed.     . Multiple Vitamin (MULTIVITAMIN WITH MINERALS) TABS Take 1 tablet by mouth daily.    . nitroGLYCERIN (NITROSTAT) 0.4 MG SL tablet Place 1 tablet (0.4 mg total) under the tongue every 5 (five) minutes as needed for chest pain. Please call for an appt for further refills 25 tablet 0  . nitroGLYCERIN (NITROSTAT) 0.4 MG SL tablet PLACE 1 TABLET UNDER THE TONGUE EVERY 5 MINUTES AS NEEDED FOR CHEST PAIN. MUST HAVE APPT 25 tablet 1  . omeprazole (PRILOSEC) 40 MG capsule Take 40 mg by mouth daily.     No facility-administered medications prior to visit.      Allergies:   Nsaids   Social History   Social History  . Marital status: Married    Spouse name: N/A  . Number of children: N/A  . Years of education: N/A   Social History Main Topics  . Smoking  status: Former Smoker    Packs/day: 1.00    Years: 15.00    Types: Cigarettes    Quit date: 08/06/1965  . Smokeless tobacco: Never Used  . Alcohol use 0.6 oz/week    1 Glasses of wine per week  . Drug use: No  . Sexual activity: No   Other Topics Concern  . None   Social History Narrative  . None     Family History:  The patient's family history includes Cancer in his brother, father, and mother; Stroke in his father.   ROS:   Please see the history of present illness.    Some difficulty with balance and frailty.  All other  systems reviewed and are negative.   PHYSICAL EXAM:   VS:  BP 114/64 (BP Location: Left Arm)   Pulse 72   Ht 5\' 8"  (1.727 m)   Wt 165 lb 12.8 oz (75.2 kg)   BMI 25.21 kg/m    GEN: Well nourished, well developed, in no acute distress  HEENT: normal  Neck: no JVD, carotid bruits, or masses Cardiac: RRR; 3/6 systolic murmur of aortic stenosis No rubs, or gallops,no edema  Respiratory:  clear to auscultation bilaterally, normal work of breathing GI: soft, nontender, nondistended, + BS MS: no deformity or atrophy  Skin: warm and dry, no rash Neuro:  Alert and Oriented x 3, Strength and sensation are intact Psych: euthymic mood, full affect  Wt Readings from Last 3 Encounters:  10/26/16 165 lb 12.8 oz (75.2 kg)  07/04/14 166 lb (75.3 kg)  09/04/13 160 lb (72.6 kg)      Studies/Labs Reviewed:   EKG:  EKG  Normal sinus rhythm, first-degree AV block, nonspecific T wave abnormality, and PACS.  Recent Labs: No results found for requested labs within last 8760 hours.   Lipid Panel No results found for: CHOL, TRIG, HDL, CHOLHDL, VLDL, LDLCALC, LDLDIRECT  Additional studies/ records that were reviewed today include:  Last echocardiogram performed greater than 10 years ago revealed aortic sclerosis.    ASSESSMENT:    1. 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 Essential hypertension   3. Other hyperlipidemia   4. Sinus node dysfunction ([facility])   5. Aortic valve stenosis, etiology of cardiac valve disease unspecified   6. Nonrheumatic aortic valve stenosis      PLAN:  In order of problems listed above:  1. Angina has been present for several years and perhaps slightly worse now than a year ago. No change in quality of life. He is not exerting himself as much now as previously. No episodes of pain at rest. With EKG not demonstrating evidence of ischemia or new infarction, will take a conservative approach. He should notify us of there is no acceleration in  symptoms. 2. Adequate blood pressure control at 114/64. Need to exclude progression of aortic valve disease as an explanation for the relatively low blood pressures in this patient at his age 89. Followed by primary care, Dr. Lavone Orn. LDL target less than 70. 4. Today's EKG demonstrates PACs but no excessive bradycardia. First-degree AV block is noted. 5. Systolic murmur consistent with aortic stenosis. I recommended a 2-D Doppler echocardiogram to help discern whether exertional complaints could be related to severe aortic stenosis versus suspected bypass graft occlusion.  Overall, [place] is stable. I do suspect bypass graft occlusion. This is not impairing functional capacity or quality of life. He prefers conservative management at this point. I do believe the aortic valve  disease has progressed over the years and determining whether or not severe aortic stenosis is present is important. An echocardiogram is been ordered. I will see him back in a year unless significant findings on the echo require follow-up sooner than planned.   Medication Adjustments/Labs and Tests Ordered: Current medicines are reviewed at length with the patient today.  Concerns regarding medicines are outlined above.  Medication changes, Labs and Tests ordered today are listed in the Patient Instructions below. Patient Instructions  Medication Instructions:  None  Labwork: None  Testing/Procedures: Your physician has requested that you have an echocardiogram. Echocardiography is a painless test that uses sound waves to create images of your heart. It provides your doctor with information about the size and shape of your heart and how well your heart's chambers and valves are working. This procedure takes approximately one hour. There are no restrictions for this procedure.    Follow-Up: Your physician wants you to follow-up in: 1 year with Dr. Tamala Julian.  You will receive a reminder letter in the mail two months in  advance. If you don't receive a letter, please call our office to schedule the follow-up appointment.   Any Other Special Instructions Will Be Listed Below (If Applicable).     If you need a refill on your cardiac medications before your next appointment, please call your pharmacy.      Signed, Sinclair Grooms, MD  10/26/2016 8:53 AM    [facility] [address], [place], [place]  [address] Phone: [phone]; Fax: [phone]

## 2016-10-26 ENCOUNTER — Encounter: Payer: Self-pay | Admitting: Interventional Cardiology

## 2016-10-26 ENCOUNTER — Ambulatory Visit (INDEPENDENT_AMBULATORY_CARE_PROVIDER_SITE_OTHER): Payer: Medicare Other | Admitting: Interventional Cardiology

## 2016-10-26 VITALS — BP 114/64 | HR 72 | Ht 68.0 in | Wt 165.8 lb

## 2016-10-26 DIAGNOSIS — I350 Nonrheumatic aortic (valve) stenosis: Secondary | ICD-10-CM

## 2016-10-26 DIAGNOSIS — E784 Other hyperlipidemia: Secondary | ICD-10-CM

## 2016-10-26 DIAGNOSIS — I10 Essential (primary) hypertension: Secondary | ICD-10-CM

## 2016-10-26 DIAGNOSIS — E7849 Other hyperlipidemia: Secondary | ICD-10-CM

## 2016-10-26 DIAGNOSIS — I25708 Atherosclerosis of coronary artery bypass graft(s), unspecified, with other forms of angina pectoris: Secondary | ICD-10-CM

## 2016-10-26 DIAGNOSIS — I495 Sick sinus syndrome: Secondary | ICD-10-CM | POA: Diagnosis not present

## 2016-10-26 NOTE — Patient Instructions (Addendum)
Medication Instructions:  None  Labwork: None  Testing/Procedures: Your physician has requested that you have an echocardiogram. Echocardiography is a painless test that uses sound waves to create images of your heart. It provides your doctor with information about the size and shape of your heart and how well your heart's chambers and valves are working. This procedure takes approximately one hour. There are no restrictions for this procedure.   Follow-Up: Your physician wants you to follow-up in: 1 year with Dr. Smith.  You will receive a reminder letter in the mail two months in advance. If you don't receive a letter, please call our office to schedule the follow-up appointment.   Any Other Special Instructions Will Be Listed Below (If Applicable).     If you need a refill on your cardiac medications before your next appointment, please call your pharmacy.   

## 2016-11-11 ENCOUNTER — Other Ambulatory Visit (HOSPITAL_COMMUNITY): Payer: Medicare Other

## 2016-11-17 ENCOUNTER — Other Ambulatory Visit: Payer: Self-pay

## 2016-11-17 ENCOUNTER — Ambulatory Visit (HOSPITAL_COMMUNITY): Payer: Medicare Other | Attending: Cardiology

## 2016-11-17 DIAGNOSIS — I10 Essential (primary) hypertension: Secondary | ICD-10-CM | POA: Insufficient documentation

## 2016-11-17 DIAGNOSIS — I083 Combined rheumatic disorders of mitral, aortic and tricuspid valves: Secondary | ICD-10-CM | POA: Diagnosis not present

## 2016-11-17 DIAGNOSIS — I350 Nonrheumatic aortic (valve) stenosis: Secondary | ICD-10-CM

## 2016-11-17 DIAGNOSIS — E785 Hyperlipidemia, unspecified: Secondary | ICD-10-CM | POA: Diagnosis not present

## 2016-11-17 LAB — ECHOCARDIOGRAM COMPLETE
AO mean calculated velocity dopler: 161 cm/s
AOPV: 0.26 m/s
AOVTI: 48.7 cm
AV Area VTI: 1 cm2
AV Mean grad: 11 mmHg
AV VEL mean LVOT/AV: 0.3
AV area mean vel ind: 0.6 cm2/m2
AV peak Index: 0.53
AVA: 1.12 cm2
AVAREAMEANV: 1.14 cm2
AVAREAVTIIND: 0.59 cm2/m2
AVLVOTPG: 1 mmHg
AVPG: 20 mmHg
AVPKVEL: 224 cm/s
CHL CUP AV VALUE AREA INDEX: 0.59
CHL CUP AV VEL: 1.12
CHL CUP TV REG PEAK VELOCITY: 206 cm/s
E decel time: 250 msec
E/e' ratio: 10.28
FS: 28 % (ref 28–44)
IVS/LV PW RATIO, ED: 1
LA diam end sys: 50 mm
LA diam index: 2.65 cm/m2
LASIZE: 50 mm
LAVOL: 72 mL
LAVOLA4C: 57 mL
LAVOLIN: 38.1 mL/m2
LDCA: 3.8 cm2
LV E/e' medial: 10.28
LVEEAVG: 10.28
LVELAT: 8.11 cm/s
LVOT SV: 54 mL
LVOT VTI: 14.3 cm
LVOT diameter: 22 mm
LVOT peak VTI: 0.29 cm
LVOTPV: 58.7 cm/s
MV Dec: 250
MV pk A vel: 92.3 m/s
MVPG: 3 mmHg
MVPKEVEL: 83.4 m/s
P 1/2 time: 720 ms
PW: 11.2 mm — AB (ref 0.6–1.1)
RV LATERAL S' VELOCITY: 13.2 cm/s
TDI e' lateral: 8.11
TDI e' medial: 7.68
TRMAXVEL: 206 cm/s

## 2018-06-26 ENCOUNTER — Emergency Department (HOSPITAL_BASED_OUTPATIENT_CLINIC_OR_DEPARTMENT_OTHER)
Admission: EM | Admit: 2018-06-26 | Discharge: 2018-06-26 | Disposition: A | Discharge status: Home / Self Care | Payer: Medicare Other | Attending: Emergency Medicine | Admitting: Emergency Medicine

## 2018-06-26 ENCOUNTER — Emergency Department (HOSPITAL_BASED_OUTPATIENT_CLINIC_OR_DEPARTMENT_OTHER): Payer: Medicare Other

## 2018-06-26 ENCOUNTER — Other Ambulatory Visit: Payer: Self-pay

## 2018-06-26 ENCOUNTER — Encounter (HOSPITAL_BASED_OUTPATIENT_CLINIC_OR_DEPARTMENT_OTHER): Payer: Self-pay

## 2018-06-26 DIAGNOSIS — M79671 Pain in right foot: Secondary | ICD-10-CM | POA: Diagnosis present

## 2018-06-26 DIAGNOSIS — I10 Essential (primary) hypertension: Secondary | ICD-10-CM | POA: Diagnosis not present

## 2018-06-26 DIAGNOSIS — D1620 Benign neoplasm of long bones of unspecified lower limb: Secondary | ICD-10-CM | POA: Diagnosis not present

## 2018-06-26 DIAGNOSIS — R2243 Localized swelling, mass and lump, lower limb, bilateral: Secondary | ICD-10-CM | POA: Insufficient documentation

## 2018-06-26 DIAGNOSIS — Z7982 Long term (current) use of aspirin: Secondary | ICD-10-CM | POA: Diagnosis not present

## 2018-06-26 DIAGNOSIS — Z79899 Other long term (current) drug therapy: Secondary | ICD-10-CM | POA: Insufficient documentation

## 2018-06-26 DIAGNOSIS — Z87891 Personal history of nicotine dependence: Secondary | ICD-10-CM | POA: Diagnosis not present

## 2018-06-26 DIAGNOSIS — D1631 Benign neoplasm of short bones of right lower limb: Secondary | ICD-10-CM

## 2018-06-26 DIAGNOSIS — R600 Localized edema: Secondary | ICD-10-CM

## 2018-06-26 DIAGNOSIS — Z951 Presence of aortocoronary bypass graft: Secondary | ICD-10-CM | POA: Diagnosis not present

## 2018-06-26 LAB — CBC WITH DIFFERENTIAL/PLATELET
BASOS ABS: 0 10*3/uL (ref 0.0–0.1)
BASOS PCT: 0 %
EOS ABS: 0.1 10*3/uL (ref 0.0–0.7)
EOS PCT: 1 %
HCT: 33.5 % — ABNORMAL LOW (ref 39.0–52.0)
HEMOGLOBIN: 11.1 g/dL — AB (ref 13.0–17.0)
LYMPHS ABS: 1.2 10*3/uL (ref 0.7–4.0)
Lymphocytes Relative: 15 %
MCH: 32.5 pg (ref 26.0–34.0)
MCHC: 33.1 g/dL (ref 30.0–36.0)
MCV: 98 fL (ref 78.0–100.0)
Monocytes Absolute: 0.7 10*3/uL (ref 0.1–1.0)
Monocytes Relative: 9 %
NEUTROS PCT: 75 %
Neutro Abs: 5.9 10*3/uL (ref 1.7–7.7)
PLATELETS: 273 10*3/uL (ref 150–400)
RBC: 3.42 MIL/uL — AB (ref 4.22–5.81)
RDW: 12.3 % (ref 11.5–15.5)
WBC: 7.8 10*3/uL (ref 4.0–10.5)

## 2018-06-26 LAB — COMPREHENSIVE METABOLIC PANEL
ALT: 14 U/L (ref 0–44)
AST: 22 U/L (ref 15–41)
Albumin: 3.5 g/dL (ref 3.5–5.0)
Alkaline Phosphatase: 273 U/L — ABNORMAL HIGH (ref 38–126)
Anion gap: 10 (ref 5–15)
BILIRUBIN TOTAL: 0.7 mg/dL (ref 0.3–1.2)
BUN: 22 mg/dL (ref 8–23)
CHLORIDE: 103 mmol/L (ref 98–111)
CO2: 28 mmol/L (ref 22–32)
Calcium: 9.1 mg/dL (ref 8.9–10.3)
Creatinine, Ser: 0.91 mg/dL (ref 0.61–1.24)
Glucose, Bld: 100 mg/dL — ABNORMAL HIGH (ref 70–99)
Potassium: 4 mmol/L (ref 3.5–5.1)
Sodium: 141 mmol/L (ref 135–145)
TOTAL PROTEIN: 7.5 g/dL (ref 6.5–8.1)

## 2018-06-26 NOTE — ED Notes (Signed)
Patient transported to X-ray 

## 2018-06-26 NOTE — ED Triage Notes (Signed)
C/o bilat leg swelling x "few days"-pain to right foot- with dx of plantar fascitis approx 1 month ago-pt presents to triage in w/c-NAD-daughter with pt

## 2018-06-26 NOTE — ED Notes (Signed)
Pt/family verbalized understanding of discharge instructions.   

## 2018-06-26 NOTE — Discharge Instructions (Addendum)
There is a spur on your calcaneus that could be contributing to your symptoms.  Incidentally, there is also an osteochondroma noticed on your ankle.  There is nothing specific to do right now but you need to follow-up with your primary care physician.  Your leg swelling can be treated with compression stockings and elevation of your legs when resting.  If you develop chest pain, shortness of breath, you cannot lie flat, or any other new/concerning symptoms and return to the ER for evaluation.

## 2018-06-26 NOTE — ED Provider Notes (Signed)
[facility]GENC[facility] DEPARTMENT Provider Note   CSN: [id number] Arrival date & time: 06/26/18  1249     History   Chief Complaint Chief Complaint  Patient presents with  . Leg Swelling    HPI Shane Berry is a 84 y.o. male.  HPI  84 year old male presents with right foot pain.  History is mostly taken from the daughter at the bedside.  Patient's been having some pain at the plantar aspect of his proximal foot as well as on his heel for about 1 month or more.  Seems to come and go.  However today he was in much more pain while visiting his wife and could not walk as far as normal.  There has been no injury.  The patient does have some memory issues developing and that is not the best historian.  However to their knowledge, there is been no injury and pain today is worse than typical.  He has not taken anything for the pain.  He is also noted to have bilateral lower extremity edema, unclear how long this is been going on.  The daughter has not noticed it before but often states that her mom is sicker than the patient and so she focuses more on her.  Patient has not noticed the leg swelling.  There is no calf pain.  No chest pain, shortness of breath, orthopnea or paroxysmal nocturnal dyspnea. Was diagnosed with plantar fasciitis by PCP about 1 month ago.  Past Medical History:  Diagnosis Date  . Allergy    seasonal  . Duodenal ulcer perforation ([place]) 07/20/2012  . Heart attack ([facility] [facility])   . Heart murmur   . HTN (hypertension)    08-06-13 Pt. denies any problems  . Hyperlipidemia     Patient Active Problem List   Diagnosis Date Noted  . Aortic stenosis 10/26/2016  . HTN (hypertension) 10/25/2016  . 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 Sinus node dysfunction ([facility]) 07/02/2013    Class: Chronic  . Heart murmur   . Hyperlipidemia   . Duodenal ulcer perforation ([place]) 07/20/2012    Class: Acute    Past Surgical History:    Procedure Laterality Date  . CORONARY ARTERY BYPASS GRAFT      x2 vessels-greater than 10 yrs  . ERCP N/A 08/14/2013   Procedure: ENDOSCOPIC RETROGRADE CHOLANGIOPANCREATOGRAPHY (ERCP);  Surgeon: Arta Silence, MD;  Location: [facility];  Service: Endoscopy;  Laterality: N/A;  . ERCP N/A 09/04/2013   Procedure: ENDOSCOPIC RETROGRADE CHOLANGIOPANCREATOGRAPHY (ERCP);  Surgeon: Arta Silence, MD;  Location: [facility];  Service: Endoscopy;  Laterality: N/A;  . EUS N/A 08/14/2013   Procedure: ESOPHAGEAL ENDOSCOPIC ULTRASOUND (EUS) RADIAL;  Surgeon: Arta Silence, MD;  Location: [facility];  Service: Endoscopy;  Laterality: N/A;  . GASTROINTESTINAL STENT REMOVAL N/A 09/04/2013   Procedure: GASTROINTESTINAL STENT REMOVAL;  Surgeon: Arta Silence, MD;  Location: [facility];  Service: Endoscopy;  Laterality: N/A;  . LAPAROTOMY  07/21/2012   Procedure: EXPLORATORY LAPAROTOMY;  Surgeon: Joyice Faster. Cornett, MD;  Location: [facility];  Service: General;  Laterality: N/A;repair "perforated colon"        Home Medications    Prior to Admission medications   Medication Sig Start Date End Date Taking? Authorizing Provider  aspirin 81 MG tablet Take 1 tablet (81 mg total) by mouth 3 (three) times a week. 09/09/13   Arta Silence, MD  atorvastatin (LIPITOR) 10 MG tablet Take 10 mg by mouth daily  with breakfast.     [provider]  ketoconazole (NIZORAL) 2 % shampoo Apply 1 application topically as needed.  05/20/14   [provider]  Multiple Vitamin (MULTIVITAMIN WITH MINERALS) TABS Take 1 tablet by mouth daily.    [provider]  nitroGLYCERIN (NITROSTAT) 0.4 MG SL tablet Place 1 tablet (0.4 mg total) under the tongue every 5 (five) minutes as needed for chest pain. Please call for an appt for further refills 03/07/16   Belva Crome, MD  nitroGLYCERIN (NITROSTAT) 0.4 MG SL tablet PLACE 1 TABLET UNDER THE TONGUE EVERY 5 MINUTES AS NEEDED FOR CHEST PAIN. MUST HAVE APPT  08/02/16   Belva Crome, MD  omeprazole (PRILOSEC) 40 MG capsule Take 40 mg by mouth daily. 07/27/12   Nat Christen, PA-C    Family History Family History  Problem Relation Age of Onset  . Cancer Mother   . Cancer Father        Lung  . Stroke Father   . Cancer Brother     Social History Social History   Tobacco Use  . Smoking status: Former Smoker    Packs/day: 1.00    Years: 15.00    Pack years: 15.00    Types: Cigarettes    Last attempt to quit: 08/06/1965    Years since quitting: 52.9  . Smokeless tobacco: Never Used  Substance Use Topics  . Alcohol use: Yes    Alcohol/week: 1.0 standard drinks    Types: 1 Glasses of wine per week    Comment: occ  . Drug use: No     Allergies   Nsaids   Review of Systems Review of Systems  Respiratory: Negative for shortness of breath.   Cardiovascular: Positive for leg swelling. Negative for chest pain.  Musculoskeletal: Positive for arthralgias. Negative for joint swelling.  Skin: Negative for color change and wound.     Physical Exam Updated Vital Signs BP (!) 155/80 (BP Location: Left Arm)   Pulse 69   Temp 98.2 F (36.8 C) (Oral)   Resp 20   Ht '5\' 7"'$  (1.702 m)   Wt 72.6 kg   SpO2 100%   BMI 25.06 kg/m   Physical Exam  Constitutional: He appears well-developed and well-nourished. No distress.  HENT:  Head: Normocephalic and atraumatic.  Right Ear: External ear normal.  Left Ear: External ear normal.  Nose: Nose normal.  Eyes: Right eye exhibits no discharge. Left eye exhibits no discharge.  Neck: Neck supple.  Cardiovascular: Normal rate and regular rhythm.  Murmur heard. Pulses:      Dorsalis pedis pulses are 2+ on the right side, and 2+ on the left side.  Pulmonary/Chest: Effort normal and breath sounds normal. He has no rales.  Abdominal: He exhibits no distension.  Musculoskeletal: He exhibits edema (pitting edema of BLE from mid-shin to feet bilaterally. ).  No calf pain bilaterally. No skin  changes. No tenderness to right ankle, foot or achilles.   Neurological: He is alert.  Skin: Skin is warm and dry. He is not diaphoretic. No erythema.  Psychiatric: His mood appears not anxious.  Nursing note and vitals reviewed.    ED Treatments / Results  Labs (all labs ordered are listed, but only abnormal results are displayed) Labs Reviewed  COMPREHENSIVE METABOLIC PANEL - Abnormal; Notable for the following components:      Result Value   Glucose, Bld 100 (*)    Alkaline Phosphatase 273 (*)    All other  components within normal limits  CBC WITH DIFFERENTIAL/PLATELET - Abnormal; Notable for the following components:   RBC 3.42 (*)    Hemoglobin 11.1 (*)    HCT 33.5 (*)    All other components within normal limits    EKG None  Radiology Dg Foot Complete Right  Result Date: 06/26/2018 CLINICAL DATA:  Plantar heel pain. EXAM: RIGHT FOOT COMPLETE - 3+ VIEW COMPARISON:  None. FINDINGS: There is a nonspecific small plantar calcaneal spur. There is ordinary calcification in the distal Achilles tendon. There is degenerative arthritis at the metatarsal phalangeal joint of the great toe. There is mild osteoarthritis of the MTP joint of the small toe. The other toes appear negative. There appears to be an osteo chondroma of the distal fibula, presumably incidental to this presentation. IMPRESSION: Small nonspecific plantar calcaneal spur. 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 Probable osteochondroma of the distal fibula, presumably incidental to this presentation. Electronically Signed   By: Nelson Chimes M.D.   On: 06/26/2018 14:04    Procedures Procedures (including critical care time)  Medications Ordered in ED Medications - No data to display   Initial Impression / Assessment and Plan / ED Course  I have reviewed the triage vital signs and the nursing notes.  Pertinent labs & imaging results that were available during my care of the patient  were reviewed by me and considered in my medical decision making (see chart for details).     Leg swelling is bilateral, with no leg pain/calf pain. Doubt DVT. Given it's symmetric, labs obtained. Are benign. Mild alk phos elevation less than before. No AKI or liver failure. Anemia similar to prior.  Foot pain could be from the calcaneal spurs.  I discussed the osteochondroma findings noted on the x-ray as well with the daughter.  Otherwise, could have a muscular pain or plantar fasciitis causing his on and off foot pain.  Highly doubt septic joint or infection.  No signs or symptoms of CHF.  Appears stable for discharge home to follow-up with PCP.  Final Clinical Impressions(s) / ED Diagnoses   Final diagnoses:  Bilateral lower extremity edema  Right foot pain  Osteochondroma of right ankle    ED Discharge Orders    None       Sherwood Gambler, MD 06/26/18 1457

## 2018-06-26 NOTE — ED Notes (Signed)
Attempted to obtain labs x 2

## 2018-12-26 DEATH — deceased

## 2019-03-31 IMAGING — DX DG FOOT COMPLETE 3+V*R*
3 series · 3 of 3 positions shown · non-contrast
Comparison: None.

CLINICAL DATA: Plantar heel pain.

EXAM:
RIGHT FOOT COMPLETE - 3+ VIEW

[foot ap]
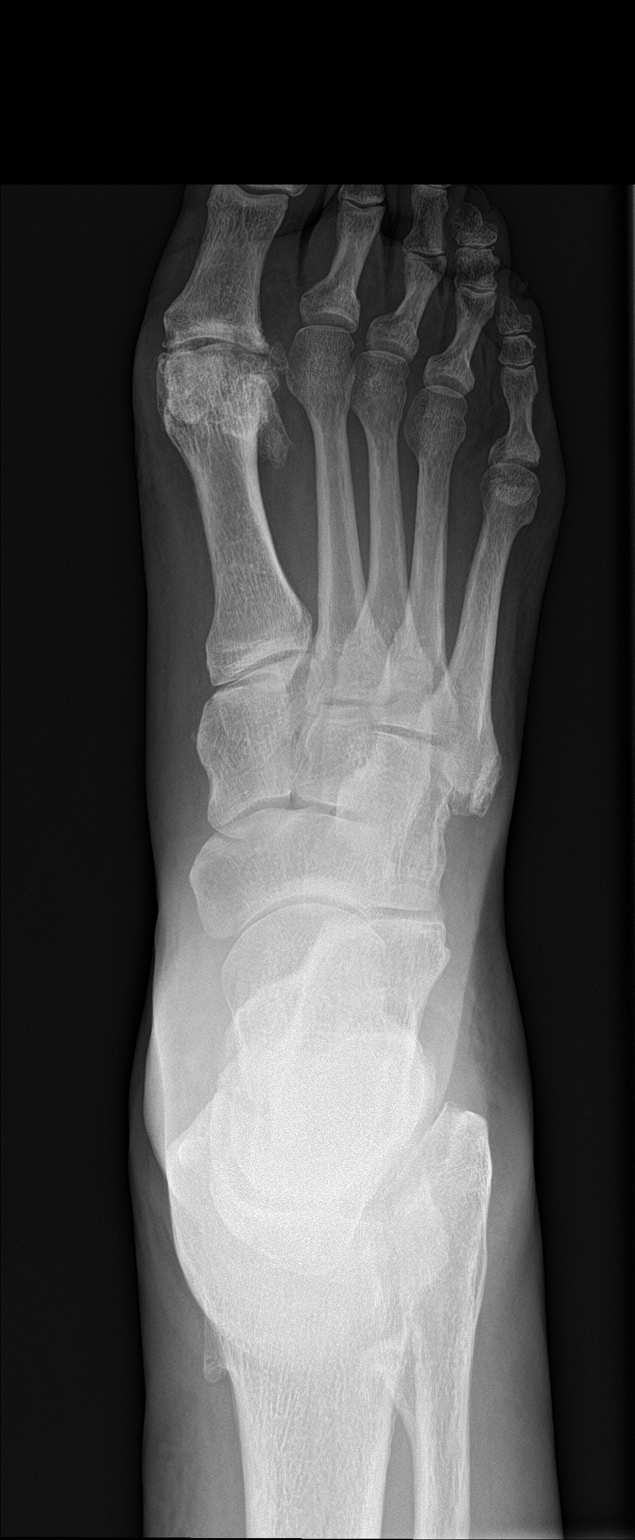

[foot obl]
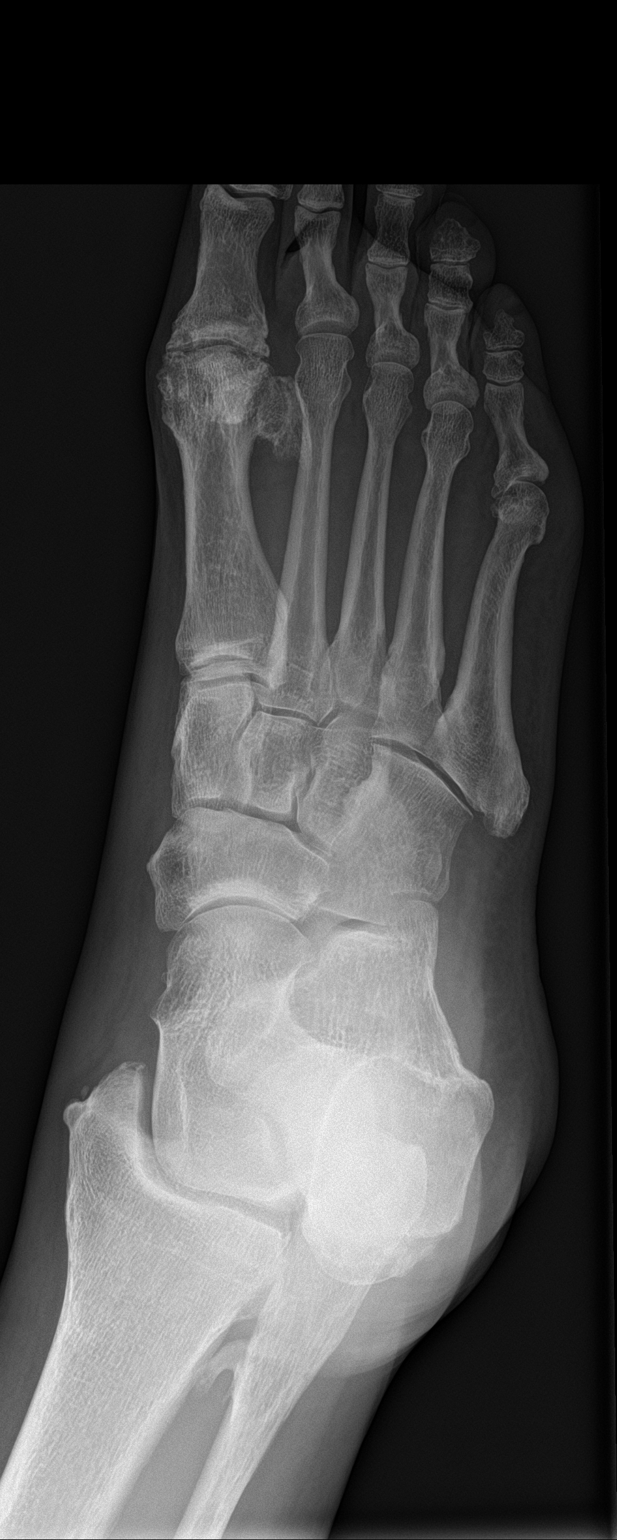

[foot lat]
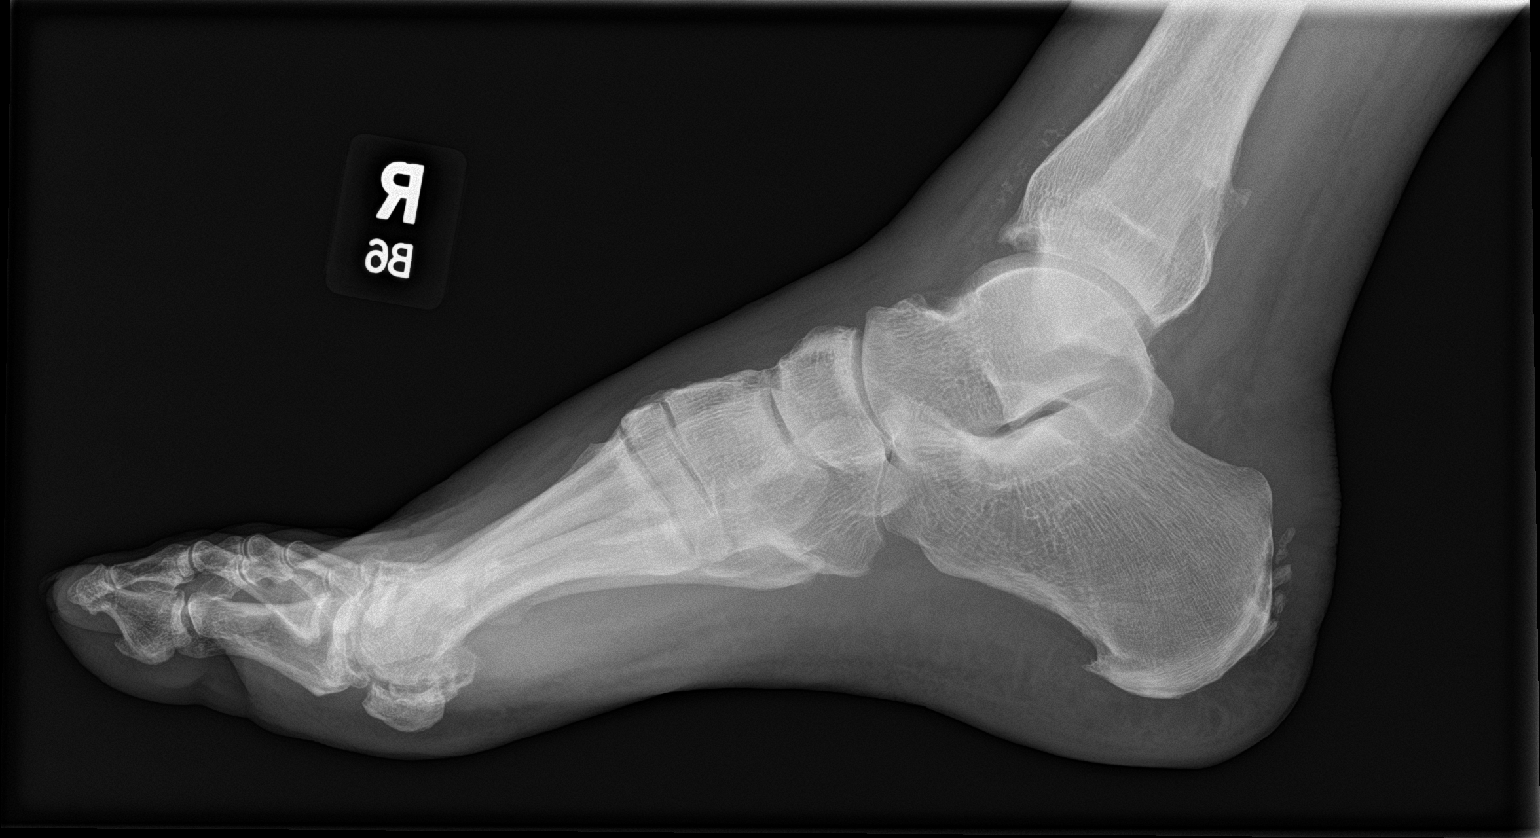

[3 of 3 positions shown; findings below may reference images not displayed]

FINDINGS: There is a nonspecific small plantar calcaneal spur. There is
ordinary calcification in the distal Achilles tendon. There is
degenerative arthritis at the metatarsal phalangeal joint of the
great toe. There is mild osteoarthritis of the MTP joint of the
small toe. The other toes appear negative. There appears to be an
osteo chondroma of the distal fibula, presumably incidental to this
presentation.
IMPRESSION: Small nonspecific plantar calcaneal spur.

Ordinary osteoarthritis at the MTP joint of the great toe and small
toe.

Probable osteochondroma of the distal fibula, presumably incidental
to this presentation.
